# Patient Record
Sex: Female | Born: 1960 | Race: White | Hispanic: No | Smoking: Former smoker
Health system: Southern US, Community
[De-identification: ages and names within clinical notes are randomized; demographics above are authoritative.]

## PROBLEM LIST (undated history)

## (undated) DIAGNOSIS — E785 Hyperlipidemia, unspecified: Secondary | ICD-10-CM

## (undated) DIAGNOSIS — M791 Myalgia, unspecified site: Secondary | ICD-10-CM

## (undated) DIAGNOSIS — E282 Polycystic ovarian syndrome: Secondary | ICD-10-CM

## (undated) DIAGNOSIS — I10 Essential (primary) hypertension: Secondary | ICD-10-CM

## (undated) DIAGNOSIS — R059 Cough, unspecified: Secondary | ICD-10-CM

## (undated) DIAGNOSIS — E039 Hypothyroidism, unspecified: Secondary | ICD-10-CM

## (undated) DIAGNOSIS — R05 Cough: Secondary | ICD-10-CM

## (undated) DIAGNOSIS — T7840XA Allergy, unspecified, initial encounter: Secondary | ICD-10-CM

## (undated) DIAGNOSIS — E119 Type 2 diabetes mellitus without complications: Secondary | ICD-10-CM

## (undated) DIAGNOSIS — R5383 Other fatigue: Secondary | ICD-10-CM

## (undated) HISTORY — DX: Type 2 diabetes mellitus without complications: E11.9

## (undated) HISTORY — PX: OOPHORECTOMY: SHX86

## (undated) HISTORY — DX: Allergy, unspecified, initial encounter: T78.40XA

## (undated) HISTORY — DX: Other fatigue: R53.83

## (undated) HISTORY — DX: Hypothyroidism, unspecified: E03.9

## (undated) HISTORY — DX: Hyperlipidemia, unspecified: E78.5

## (undated) HISTORY — PX: ABDOMINAL HYSTERECTOMY: SHX81

## (undated) HISTORY — DX: Myalgia, unspecified site: M79.10

## (undated) HISTORY — DX: Polycystic ovarian syndrome: E28.2

## (undated) HISTORY — DX: Cough, unspecified: R05.9

## (undated) HISTORY — DX: Cough: R05

---

## 2007-07-21 ENCOUNTER — Emergency Department: Payer: Self-pay | Admitting: Emergency Medicine

## 2011-01-27 ENCOUNTER — Emergency Department (HOSPITAL_COMMUNITY)
Admission: EM | Admit: 2011-01-27 | Discharge: 2011-01-27 | Disposition: A | Discharge status: Home / Self Care | Payer: No Typology Code available for payment source | Attending: Emergency Medicine | Admitting: Emergency Medicine

## 2011-01-27 ENCOUNTER — Emergency Department (HOSPITAL_COMMUNITY): Payer: No Typology Code available for payment source

## 2011-01-27 DIAGNOSIS — J45909 Unspecified asthma, uncomplicated: Secondary | ICD-10-CM | POA: Insufficient documentation

## 2011-01-27 DIAGNOSIS — M542 Cervicalgia: Secondary | ICD-10-CM | POA: Insufficient documentation

## 2011-01-27 DIAGNOSIS — S7000XA Contusion of unspecified hip, initial encounter: Secondary | ICD-10-CM | POA: Insufficient documentation

## 2011-01-27 DIAGNOSIS — I10 Essential (primary) hypertension: Secondary | ICD-10-CM | POA: Insufficient documentation

## 2011-01-27 DIAGNOSIS — E039 Hypothyroidism, unspecified: Secondary | ICD-10-CM | POA: Insufficient documentation

## 2011-01-27 DIAGNOSIS — M25559 Pain in unspecified hip: Secondary | ICD-10-CM | POA: Insufficient documentation

## 2011-11-16 ENCOUNTER — Encounter (HOSPITAL_COMMUNITY): Payer: Self-pay

## 2011-11-16 ENCOUNTER — Emergency Department (INDEPENDENT_AMBULATORY_CARE_PROVIDER_SITE_OTHER): Payer: No Typology Code available for payment source

## 2011-11-16 ENCOUNTER — Emergency Department (INDEPENDENT_AMBULATORY_CARE_PROVIDER_SITE_OTHER)
Admission: EM | Admit: 2011-11-16 | Discharge: 2011-11-16 | Disposition: A | Discharge status: Home / Self Care | Payer: Self-pay | Source: Home / Self Care | Attending: Family Medicine | Admitting: Family Medicine

## 2011-11-16 DIAGNOSIS — S8000XA Contusion of unspecified knee, initial encounter: Secondary | ICD-10-CM

## 2011-11-16 DIAGNOSIS — M25469 Effusion, unspecified knee: Secondary | ICD-10-CM

## 2011-11-16 HISTORY — DX: Essential (primary) hypertension: I10

## 2011-11-16 MED ORDER — HYDROCODONE-ACETAMINOPHEN 5-325 MG PO TABS: ORAL_TABLET | ORAL | Status: AC

## 2011-11-16 NOTE — ED Provider Notes (Addendum)
History     CSN: [id number]  Arrival date & time 11/16/11  1419   First MD Initiated Contact with Patient 11/16/11 1526      Chief Complaint  Patient presents with  . Leg Injury    (Consider location/radiation/quality/duration/timing/severity/associated sxs/prior treatment) HPI Comments: Lacey Hines presents for evaluation of pain, swelling, and bruising over her RIGHT knee. She reports that she slipped earlier today at a restaurant on an area of floor where some tea was spilled. She struck her RIGHT knee on a marble floor. She did not strike her head.   Patient is a 51 y.o. female presenting with knee pain. The history is provided by the patient.  Knee Pain This is a new problem. The problem occurs constantly. The problem has not changed since onset.The symptoms are aggravated by walking, bending, exertion and standing. The symptoms are relieved by nothing.    Past Medical History  Diagnosis Date  . Asthma   . Hypertension   . Thyroid disease     History reviewed. No pertinent past surgical history.  History reviewed. No pertinent family history.  History  Substance Use Topics  . Smoking status: Never Smoker   . Smokeless tobacco: Not on file  . Alcohol Use: No    OB History    Grav Para Term Preterm Abortions TAB SAB Ect Mult Living                  Review of Systems  Constitutional: Negative.   HENT: Negative.   Eyes: Negative.   Respiratory: Negative.   Cardiovascular: Negative.   Gastrointestinal: Negative.   Genitourinary: Negative.   Musculoskeletal: Positive for joint swelling and gait problem.       RIGHT knee pain  Skin: Negative.   Neurological: Negative.     Allergies  Review of patient's allergies indicates no known allergies.  Home Medications   Current Outpatient Rx  Name Route Sig Dispense Refill  . AMLODIPINE BESYLATE 2.5 MG PO TABS Oral Take 5 mg by mouth daily.    Marland Kitchen LEVOTHYROXINE SODIUM 25 MCG PO TABS Oral Take 25 mcg by mouth daily.     Marland Kitchen METFORMIN HCL 1000 MG PO TABS Oral Take 1,000 mg by mouth 2 (two) times daily with a meal.    . HYDROCODONE-ACETAMINOPHEN 5-325 MG PO TABS  Take one to two tablets every 4 to 6 hours as needed for pain 20 tablet 0    BP 152/85  Pulse 70  Temp(Src) 98.6 F (37 C) (Oral)  Resp 18  SpO2 100%  Physical Exam  Nursing note and vitals reviewed. Constitutional: She is oriented to person, place, and time. She appears well-developed and well-nourished.  HENT:  Head: Normocephalic and atraumatic.  Eyes: EOM are normal.  Neck: Normal range of motion.  Pulmonary/Chest: Effort normal.  Musculoskeletal: Normal range of motion.       Right knee: She exhibits swelling and ecchymosis. She exhibits no laceration. tenderness found. Medial joint line tenderness noted. No lateral joint line, no MCL, no LCL and no patellar tendon tenderness noted.       Legs: Neurological: She is alert and oriented to person, place, and time.  Skin: Skin is warm and dry.  Psychiatric: Her behavior is normal.    ED Course  Procedures (including critical care time)  Labs Reviewed - No data to display Dg Knee Ap/lat W/sunrise Right  11/16/2011  *RADIOLOGY REPORT*  Clinical Data: Fall, knee pain.  DG KNEE - 3 VIEWS  Comparison:  None  Findings: No acute bony abnormality.  Specifically, no fracture, subluxation, or dislocation.  Soft tissues are intact.  No joint effusion.  Soft tissue swelling over the anterior aspect of the lower knee.  IMPRESSION: Anterior soft tissue swelling.  No underlying bony abnormality.  No effusion.  Original Report Authenticated By: Cyndie Chime, M.D.     1. Knee contusion   2. Prepatellar effusion   3. Traumatic hematoma of knee       MDM  RIGHT knee xray: no acute findings; reviewed by radiologist and myself; knee contusion and prepatellar hematoma; rx given for hydrocodone and RICE        Richardo Priest, MD 11/16/11 1708  Richardo Priest, MD 11/16/11 1739

## 2011-11-16 NOTE — ED Notes (Signed)
Slipped on wet spot  floor  ~8;30 am today , landed on right knee and right elbow ; was partly numb until just PTA; pain , swelling, echymosis noted ; denies LOC

## 2013-05-18 ENCOUNTER — Encounter: Payer: Self-pay | Admitting: Nurse Practitioner

## 2013-05-18 ENCOUNTER — Ambulatory Visit (INDEPENDENT_AMBULATORY_CARE_PROVIDER_SITE_OTHER): Payer: 59 | Admitting: Nurse Practitioner

## 2013-05-18 VITALS — BP 148/96 | HR 70 | Temp 98.0°F | Resp 14 | Ht 63.0 in | Wt 231.8 lb

## 2013-05-18 DIAGNOSIS — I10 Essential (primary) hypertension: Secondary | ICD-10-CM

## 2013-05-18 DIAGNOSIS — E119 Type 2 diabetes mellitus without complications: Secondary | ICD-10-CM

## 2013-05-18 DIAGNOSIS — E785 Hyperlipidemia, unspecified: Secondary | ICD-10-CM

## 2013-05-18 DIAGNOSIS — E039 Hypothyroidism, unspecified: Secondary | ICD-10-CM | POA: Insufficient documentation

## 2013-05-18 NOTE — Patient Instructions (Signed)
Will get blood work today  In 4 weeks please return for physical with lab work before visit

## 2013-05-18 NOTE — Progress Notes (Signed)
Patient ID: Lacey Hines, female   DOB: 04-Mar-1961, 52 y.o.   MRN: [id number]   No Known Allergies  Chief Complaint  Patient presents with  . NP to Establish    HPI: Patient is a 52 y.o. 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 Negative for fever and chills.       Weight gain and sweats which has been worse since thyroid medication  HENT: Positive for ear pain (pain in right ear for a few days), congestion and sore throat. Negative for hearing loss, tinnitus and ear discharge.        Ear pain, nasal and chest congestion, sore throat for 3 days   Eyes:       Wears reading glasses; has not been to an eye MD   Respiratory: Positive for cough, sputum production (at times she has clear sputum), shortness of breath (in the last few weeks she has had to use her albuterol 4 times  in 2 weeks) and wheezing.   Cardiovascular: Positive for palpitations (with pain in the last month). Negative for chest pain.  Gastrointestinal: Negative for heartburn, abdominal pain, diarrhea and constipation.  Genitourinary: Negative for dysuria, urgency and frequency.  Musculoskeletal: Positive for myalgias and joint pain.       Takes ibuprofen- once weekly to help    Skin: Negative.   Neurological: Positive for dizziness (hx of vertigo), tingling (in both lower legs) and headaches.  Endo/Heme/Allergies: Positive for environmental allergies.  Psychiatric/Behavioral: The patient has insomnia (no trouble going to sleep but trouble staying to sleep).        Mood swings     Past Medical History  Diagnosis Date  . Asthma   . Hypertension   . Hypothyroid   . Other and unspecified hyperlipidemia    . Polycystic disease, ovaries   . Cough   . Type II or unspecified type diabetes mellitus without mention of complication, not stated as uncontrolled   . Fatigue   . Myalgia   . Allergy    Past Surgical History  Procedure Laterality Date  . Oophorectomy    . Abdominal hysterectomy      complete hysterectomy   Social History:   reports that she has quit smoking. She does not have any smokeless tobacco history on file. She reports that she does not drink alcohol or use illicit drugs.  Family History  Problem Relation Age of Onset  . Hypertension Mother   . Hyperlipidemia Mother   . Diabetes Father   . Heart disease Father   . Stroke Father   . Hypertension Father   . Hyperlipidemia Father   . Hypertension Brother   . Hyperlipidemia Brother   . Hypertension Brother   . Hyperlipidemia Brother   . Hypertension Brother   . Hyperlipidemia Brother     Medications: Patient's Medications  New Prescriptions   No medications on file  Previous Medications   LEVOTHYROXINE (SYNTHROID, LEVOTHROID) 50 MCG TABLET    Take 50 mcg by mouth daily before breakfast.   METFORMIN (GLUCOPHAGE) 500 MG TABLET    Take 500 mg by mouth 2 (two) times daily with a meal.  Modified Medications  No medications on file  Discontinued Medications   AMLODIPINE (NORVASC) 2.5 MG TABLET    Take 5 mg by mouth daily.   LEVOTHYROXINE (SYNTHROID, LEVOTHROID) 25 MCG TABLET    Take 25 mcg by mouth daily.   METFORMIN (GLUCOPHAGE) 1000 MG TABLET    Take 1,000 mg by mouth 2 (two) times daily with a meal.     Physical Exam:  Filed Vitals:   05/18/13 1327  BP: 148/96  Pulse: 70  Temp: 98 F (36.7 C)  TempSrc: Oral  Resp: 14  Height: 5\' 3"  (1.6 m)  Weight: 231 lb 12.8 oz (105.144 kg)   Physical Exam  Constitutional: She is oriented to person, place, and time and well-developed, well-nourished, and in no distress. No distress.  HENT:  Head: Normocephalic and atraumatic.  Right Ear: External ear normal.   Left Ear: External ear normal.  Eyes: Conjunctivae and EOM are normal. Pupils are equal, round, and reactive to light.  Neck: Normal range of motion. Neck supple.  Cardiovascular: Normal rate, regular rhythm and normal heart sounds.   Pulmonary/Chest: Effort normal and breath sounds normal. No respiratory distress. She has no wheezes.  Abdominal: Soft. Bowel sounds are normal. She exhibits no distension. There is no tenderness.  Musculoskeletal: Normal range of motion. She exhibits no edema and no tenderness.  Neurological: She is alert and oriented to person, place, and time.  Skin: Skin is warm and dry. She is not diaphoretic.  Psychiatric: Affect normal.    Labs reviewed: Immunization: influenza 2013 Pneumococcal 2009  Tetanus - unknown   Assessment/Plan  1.   Hypothyroid 244.9     Pt was previously on synthroid 50 mcg but has not taking this for over a month in a half. Will get TSH today   2.   Other and unspecified hyperlipidemia 272.4     Will get fasting lipids before next visit    3.   Hypertension 401.9     Pt currently not on medication for this- will cont to monitor BP and get labs at this time. Encouraged  heart healthy diet    4.   Type II or unspecified type diabetes mellitus without mention of complication, not stated as uncontrolled   Will get AIC and urine for protein today  To follow up in 1 month for EV

## 2013-05-19 LAB — CBC WITH DIFFERENTIAL
Basophils Absolute: 0 10*3/uL (ref 0.0–0.2)
Immature Grans (Abs): 0 10*3/uL (ref 0.0–0.1)
Immature Granulocytes: 0 % (ref 0–2)
Lymphocytes Absolute: 2.5 10*3/uL (ref 0.7–3.1)
Lymphs: 35 % (ref 14–46)
MCV: 88 fL (ref 79–97)
Monocytes: 8 % (ref 4–12)
Neutrophils Relative %: 55 % (ref 40–74)
Platelets: 359 10*3/uL (ref 150–379)
RBC: 4.68 x10E6/uL (ref 3.77–5.28)
RDW: 14.5 % (ref 12.3–15.4)
WBC: 7.2 10*3/uL (ref 3.4–10.8)

## 2013-05-19 LAB — HEMOGLOBIN A1C
Est. average glucose Bld gHb Est-mCnc: 137 mg/dL
Hgb A1c MFr Bld: 6.4 % — ABNORMAL HIGH (ref 4.8–5.6)

## 2013-05-19 LAB — COMPREHENSIVE METABOLIC PANEL
ALT: 29 IU/L (ref 0–32)
AST: 20 IU/L (ref 0–40)
CO2: 22 mmol/L (ref 18–29)
Calcium: 9.6 mg/dL (ref 8.7–10.2)
Chloride: 104 mmol/L (ref 97–108)
Glucose: 104 mg/dL — ABNORMAL HIGH (ref 65–99)
Potassium: 4 mmol/L (ref 3.5–5.2)
Sodium: 142 mmol/L (ref 134–144)
Total Protein: 7.2 g/dL (ref 6.0–8.5)

## 2013-05-19 LAB — MICROALBUMIN, URINE: Microalbumin, Urine: 6.2 ug/mL (ref 0.0–17.0)

## 2013-06-11 ENCOUNTER — Other Ambulatory Visit: Payer: 59

## 2013-06-11 DIAGNOSIS — E785 Hyperlipidemia, unspecified: Secondary | ICD-10-CM

## 2013-06-12 LAB — LIPID PANEL
Cholesterol, Total: 227 mg/dL — ABNORMAL HIGH (ref 100–199)
LDL Calculated: 154 mg/dL — ABNORMAL HIGH (ref 0–99)

## 2013-06-15 ENCOUNTER — Ambulatory Visit (INDEPENDENT_AMBULATORY_CARE_PROVIDER_SITE_OTHER): Payer: 59 | Admitting: Nurse Practitioner

## 2013-06-15 ENCOUNTER — Encounter: Payer: Self-pay | Admitting: Nurse Practitioner

## 2013-06-15 VITALS — BP 142/90 | HR 71 | Temp 99.5°F | Resp 16 | Ht 63.0 in | Wt 230.4 lb

## 2013-06-15 DIAGNOSIS — E785 Hyperlipidemia, unspecified: Secondary | ICD-10-CM

## 2013-06-15 DIAGNOSIS — E039 Hypothyroidism, unspecified: Secondary | ICD-10-CM

## 2013-06-15 DIAGNOSIS — I10 Essential (primary) hypertension: Secondary | ICD-10-CM

## 2013-06-15 DIAGNOSIS — J329 Chronic sinusitis, unspecified: Secondary | ICD-10-CM

## 2013-06-15 DIAGNOSIS — E119 Type 2 diabetes mellitus without complications: Secondary | ICD-10-CM

## 2013-06-15 DIAGNOSIS — Z23 Encounter for immunization: Secondary | ICD-10-CM

## 2013-06-15 DIAGNOSIS — Z139 Encounter for screening, unspecified: Secondary | ICD-10-CM

## 2013-06-15 DIAGNOSIS — Z0000 Encounter for general adult medical examination without abnormal findings: Secondary | ICD-10-CM

## 2013-06-15 MED ORDER — METFORMIN HCL 500 MG PO TABS: 500.0000 mg | ORAL_TABLET | Freq: Two times a day (BID) | ORAL | Status: DC

## 2013-06-15 MED ORDER — ATORVASTATIN CALCIUM 10 MG PO TABS: 10.0000 mg | ORAL_TABLET | Freq: Every day | ORAL | Status: DC

## 2013-06-15 MED ORDER — LOSARTAN POTASSIUM 25 MG PO TABS: 25.0000 mg | ORAL_TABLET | Freq: Every day | ORAL | Status: DC

## 2013-06-15 MED ORDER — AMOXICILLIN-POT CLAVULANATE 875-125 MG PO TABS: 1.0000 | ORAL_TABLET | Freq: Two times a day (BID) | ORAL | Status: DC

## 2013-06-15 NOTE — Patient Instructions (Addendum)
Cholesterol: starting you on Lipitor 10 mg every night - cont heart healthy diabetic diet and exercise 30 mins 5 times a day  Diabetes: cont metformin twice daily; cont heart healthy diabetic diet and exercise 30 mins 5 times a day  Thyroid: not on medication- will remove this from our records and cont to follow your thyroid High blood pressure: will start you on losartan 25 mg daily for blood pressure; cont heart healthy diabetic diet and exercise 30 mins 5 times a day  Insomnia: may take melatonin 30 mins-1 hr before bed.  Preventive Healthcare- you do not want colonoscopy so we will get a stool sample for blood, will order screening mammogram, given TDAP vaccine today  Sinusitis- Augmentin twice daily for 10 days has been faxed to your pharmacy   Cardiac Diet This diet can help prevent heart disease and stroke. Many factors influence your heart health, including eating and exercise habits. Coronary risk rises a lot with abnormal blood fat (lipid) levels. Cardiac meal planning includes limiting unhealthy fats, increasing healthy fats, and making other small dietary changes. General guidelines are as follows:  Adjust calorie intake to reach and maintain desirable body weight.  Limit total fat intake to less than 30% of total calories. Saturated fat should be less than 7% of calories.  Saturated fats are found in animal products and in some vegetable products. Saturated vegetable fats are found in coconut oil, cocoa butter, palm oil, and palm kernel oil. Read labels carefully to avoid these products as much as possible. Use butter in moderation. Choose tub margarines and oils that have 2 grams of fat or less. Good cooking oils are canola and olive oils.  Practice low-fat cooking techniques. Do not fry food. Instead, broil, bake, boil, steam, grill, roast on a rack, stir-fry, or microwave it. Other fat reducing suggestions include:  Remove the skin from poultry.  Remove all visible fat from  meats.  Skim the fat off stews, soups, and gravies before serving them.  Steam vegetables in water or broth instead of sauting them in fat.  Avoid foods with trans fat (or hydrogenated oils), such as commercially fried foods and commercially baked goods. Commercial shortening and deep-frying fats will contain trans fat.  Increase intake of fruits, vegetables, whole grains, and legumes to replace foods high in fat.  Increase consumption of nuts, legumes, and seeds to at least 4 servings weekly. One serving of a legume equals  cup, and 1 serving of nuts or seeds equals  cup.  Choose whole grains more often. Have 3 servings per day (a serving is 1 ounce [oz]).  Eat 4 to 5 servings of vegetables per day. A serving of vegetables is 1 cup of raw leafy vegetables;  cup of raw or cooked cut-up vegetables;  cup of vegetable juice.  Eat 4 to 5 servings of fruit per day. A serving of fruit is 1 medium whole fruit;  cup of dried fruit;  cup of fresh, frozen, or canned fruit;  cup of 100% fruit juice.  Increase your intake of dietary fiber to 20 to 30 grams per day. Insoluble fiber may help lower your risk of heart disease and may help curb your appetite.  Soluble fiber binds cholesterol to be removed from the blood. Foods high in soluble fiber are dried beans, citrus fruits, oats, apples, bananas, broccoli, Brussels sprouts, and eggplant.  Try to include foods fortified with plant sterols or stanols, such as yogurt, breads, juices, or margarines. Choose several fortified foods  to achieve a daily intake of 2 to 3 grams of plant sterols or stanols.  Foods with omega-3 fats can help reduce your risk of heart disease. Aim to have a 3.5 oz portion of fatty fish twice per week, such as salmon, mackerel, albacore tuna, sardines, lake trout, or herring. If you wish to take a fish oil supplement, choose one that contains 1 gram of both DHA and EPA.  Limit processed meats to 2 servings (3 oz portion)  weekly.  Limit the sodium in your diet to 1500 milligrams (mg) per day. If you have high blood pressure, talk to a registered dietitian about a DASH (Dietary Approaches to Stop Hypertension) eating plan.  Limit sweets and beverages with added sugar, such as soda, to no more than 5 servings per week. One serving is:   1 tablespoon sugar.  1 tablespoon jelly or jam.   cup sorbet.  1 cup lemonade.   cup regular soda. CHOOSING FOODS Starches  Allowed: Breads: All kinds (wheat, rye, raisin, white, oatmeal, [place], [place], and English muffin bread). Low-fat rolls: English muffins, frankfurter and hamburger buns, bagels, pita bread, tortillas (not fried). Pancakes, waffles, biscuits, and muffins made with recommended oil.  Avoid: Products made with saturated or trans fats, oils, or whole milk products. Butter rolls, cheese breads, croissants. Commercial doughnuts, muffins, sweet rolls, biscuits, waffles, pancakes, store-bought mixes. Crackers  Allowed: Low-fat crackers and snacks: Animal, graham, rye, saltine (with recommended oil, no lard), oyster, and matzo crackers. Bread sticks, melba toast, rusks, flatbread, pretzels, and light popcorn.  Avoid: High-fat crackers: cheese crackers, butter crackers, and those made with coconut, palm oil, or trans fat (hydrogenated oils). Buttered popcorn. Cereals  Allowed: Hot or cold whole-grain cereals.  Avoid: Cereals containing coconut, hydrogenated vegetable fat, or animal fat. Potatoes / Pasta / Rice  Allowed: All kinds of potatoes, rice, and pasta (such as macaroni, spaghetti, and noodles).  Avoid: Pasta or rice prepared with cream sauce or high-fat cheese. Chow mein noodles, [place] fries. Vegetables  Allowed: All vegetables and vegetable juices.  Avoid: Fried vegetables. Vegetables in cream, butter, or high-fat cheese sauces. Limit coconut. Fruit in cream or custard. Protein  Allowed: Limit your intake of meat, seafood, and poultry  to no more than 6 oz (cooked weight) per day. All lean, well-trimmed beef, veal, pork, and lamb. All chicken and [place] without skin. All fish and shellfish. Wild game: wild duck, rabbit, pheasant, and venison. Egg whites or low-cholesterol egg substitutes may be used as desired. Meatless dishes: recipes with dried beans, peas, lentils, and tofu (soybean curd). Seeds and nuts: all seeds and most nuts.  Avoid: Prime grade and other heavily marbled and fatty meats, such as short ribs, spare ribs, rib eye roast or steak, frankfurters, sausage, bacon, and high-fat luncheon meats, mutton. Caviar. Commercially fried fish. Domestic duck, goose, venison sausage. Organ meats: liver, gizzard, heart, chitterlings, brains, kidney, sweetbreads. Dairy  Allowed: Low-fat cheeses: nonfat or low-fat cottage cheese (1% or 2% fat), cheeses made with part skim milk, such as mozzarella, farmers, string, or ricotta. (Cheeses should be labeled no more than 2 to 6 grams fat per oz.). Skim (or 1%) milk: liquid, powdered, or evaporated. Buttermilk made with low-fat milk. Drinks made with skim or low-fat milk or cocoa. Chocolate milk or cocoa made with skim or low-fat (1%) milk. Nonfat or low-fat yogurt.  Avoid: Whole milk cheeses, including colby, cheddar, [place], [address], [place], [place], [place], [address], [place], and blue. Creamed cottage cheese, cream cheese. Whole milk  and whole milk products, including buttermilk or yogurt made from whole milk, drinks made from whole milk. Condensed milk, evaporated whole milk, and 2% milk. Soups and Combination Foods  Allowed: Low-fat low-sodium soups: broth, dehydrated soups, homemade broth, soups with the fat removed, homemade cream soups made with skim or low-fat milk. Low-fat spaghetti, lasagna, chili, and Spanish rice if low-fat ingredients and low-fat cooking techniques are used.  Avoid: Cream soups made with whole milk, cream, or high-fat cheese. All other soups. Desserts and  Sweets  Allowed: Sherbet, fruit ices, gelatins, meringues, and angel food cake. Homemade desserts with recommended fats, oils, and milk products. Jam, jelly, honey, marmalade, sugars, and syrups. Pure sugar candy, such as gum drops, hard candy, jelly beans, marshmallows, mints, and small amounts of dark chocolate.  Avoid: Commercially prepared cakes, pies, cookies, frosting, pudding, or mixes for these products. Desserts containing whole milk products, chocolate, coconut, lard, palm oil, or palm kernel oil. Ice cream or ice cream drinks. Candy that contains chocolate, coconut, butter, hydrogenated fat, or unknown ingredients. Buttered syrups. Fats and Oils  Allowed: Vegetable oils: safflower, sunflower, corn, soybean, cottonseed, sesame, canola, olive, or peanut. Non-hydrogenated margarines. Salad dressing or mayonnaise: homemade or commercial, made with a recommended oil. Low or nonfat salad dressing or mayonnaise.  Limit added fats and oils to 6 to 8 tsp per day (includes fats used in cooking, baking, salads, and spreads on bread). Remember to count the "hidden fats" in foods.  Avoid: Solid fats and shortenings: butter, lard, salt pork, bacon drippings. Gravy containing meat fat, shortening, or suet. Cocoa butter, coconut. Coconut oil, palm oil, palm kernel oil, or hydrogenated oils: these ingredients are often used in bakery products, nondairy creamers, whipped toppings, candy, and commercially fried foods. Read labels carefully. Salad dressings made of unknown oils, sour cream, or cheese, such as blue cheese and Roquef[place]. Cream, all kinds: half-and-half, light, heavy, or whipping. Sour cream or cream cheese (even if "light" or low-fat). Nondairy cream substitutes: coffee creamers and sour cream substitutes made with palm, palm kernel, hydrogenated oils, or coconut oil. Beverages  Allowed: Coffee (regular or decaffeinated), tea. Diet carbonated beverages, mineral water. Alcohol: Check with your  caregiver. Moderation is recommended.  Avoid: Whole milk, regular sodas, and juice drinks with added sugar. Condiments  Allowed: All seasonings and condiments. Cocoa powder. "Cream" sauces made with recommended ingredients.  Avoid: Carob powder made with hydrogenated fats. SAMPLE MENU Breakfast   cup orange juice   cup oatmeal  1 slice toast  1 tsp margarine  1 cup skim milk Lunch  [place] sandwich with 2 oz [place], 2 slices bread  Lettuce and tomato slices  Fresh fruit  Carrot sticks  Coffee or tea Snack  Fresh fruit or low-fat crackers Dinner  3 oz lean ground beef  1 baked potato  1 tsp margarine   cup asparagus  Lettuce salad  1 tbs non-creamy dressing   cup peach slices  1 cup skim milk Document Released: 07/31/2008 Document Revised: 04/22/2012 Document Reviewed: 01/15/2012 Exit[employer] Patient Information 2014 [place], [place].  Sinusitis Sinusitis is redness, soreness, and swelling (inflammation) of the paranasal sinuses. Paranasal sinuses are air pockets within the bones of your face (beneath the eyes, the middle of the forehead, or above the eyes). In healthy paranasal sinuses, mucus is able to drain out, and air is able to circulate through them by way of your nose. However, when your paranasal sinuses are inflamed, mucus and air can become trapped. This can allow bacteria and  other germs to grow and cause infection. Sinusitis can develop quickly and last only a short time (acute) or continue over a long period (chronic). Sinusitis that lasts for more than 12 weeks is considered chronic.  CAUSES  Causes of sinusitis include:  Allergies.  Structural abnormalities, such as displacement of the cartilage that separates your nostrils (deviated septum), which can decrease the air flow through your nose and sinuses and affect sinus drainage.  Functional abnormalities, such as when the small hairs (cilia) that line your sinuses and help remove mucus do  not work properly or are not present. SYMPTOMS  Symptoms of acute and chronic sinusitis are the same. The primary symptoms are pain and pressure around the affected sinuses. Other symptoms include:  Upper toothache.  Earache.  Headache.  Bad breath.  Decreased sense of smell and taste.  A cough, which worsens when you are lying flat.  Fatigue.  Fever.  Thick drainage from your nose, which often is green and may contain pus (purulent).  Swelling and warmth over the affected sinuses. DIAGNOSIS  Your caregiver will perform a physical exam. During the exam, your caregiver may:  Look in your nose for signs of abnormal growths in your nostrils (nasal polyps).  Tap over the affected sinus to check for signs of infection.  View the inside of your sinuses (endoscopy) with a special imaging device with a light attached (endoscope), which is inserted into your sinuses. If your caregiver suspects that you have chronic sinusitis, one or more of the following tests may be recommended:  Allergy tests.  Nasal culture A sample of mucus is taken from your nose and sent to a lab and screened for bacteria.  Nasal cytology A sample of mucus is taken from your nose and examined by your caregiver to determine if your sinusitis is related to an allergy. TREATMENT  Most cases of acute sinusitis are related to a viral infection and will resolve on their own within 10 days. Sometimes medicines are prescribed to help relieve symptoms (pain medicine, decongestants, nasal steroid sprays, or saline sprays).  However, for sinusitis related to a bacterial infection, your caregiver will prescribe antibiotic medicines. These are medicines that will help kill the bacteria causing the infection.  Rarely, sinusitis is caused by a fungal infection. In theses cases, your caregiver will prescribe antifungal medicine. For some cases of chronic sinusitis, surgery is needed. Generally, these are cases in which  sinusitis recurs more than 3 times per year, despite other treatments. HOME CARE INSTRUCTIONS   Drink plenty of water. Water helps thin the mucus so your sinuses can drain more easily.  Use a humidifier.  Inhale steam 3 to 4 times a day (for example, sit in the bathroom with the shower running).  Apply a warm, moist washcloth to your face 3 to 4 times a day, or as directed by your caregiver.  Use saline nasal sprays to help moisten and clean your sinuses.  Take over-the-counter or prescription medicines for pain, discomfort, or fever only as directed by your caregiver. SEEK IMMEDIATE MEDICAL CARE IF:  You have increasing pain or severe headaches.  You have nausea, vomiting, or drowsiness.  You have swelling around your face.  You have vision problems.  You have a stiff neck.  You have difficulty breathing. MAKE SURE YOU:   Understand these instructions.  Will watch your condition.  Will get help right away if you are not doing well or get worse. Document Released: 10/22/2005 Document Revised: 01/14/2012 Document  Reviewed: 11/06/2011 Exit[employer] Patient Information 2014 [place], [place].

## 2013-06-15 NOTE — Progress Notes (Addendum)
Patient ID: Lacey Hines, female   DOB: 05/05/1961, 52 y.o.   MRN: [id number]   No Known Allergies  Chief Complaint  Patient presents with  . Follow-up    ?thyroid, ears, throat and congestion,     HPI: Patient is a 52 y.o. female seen in the office today for extended visit.  Pt reports she has always had a problem with blood pressure and cholesterol. She always has eaten lean meat, fruits and vegetables, but due to heredity these have been high. Reports she is very active and does not live a sedentary lifestyle. Not sleeping which is new but she has had it before, was previously on Ambien CR which helped her. Has taken valarien root which has helped.  Was not on thyroid medication at last visit for atleast a month and TSH was 2.070 so therefore she never resumed thyroid medications.   Still having nasal congestion, ear pain, sore throat and yellow discharge from nose which has been ongoing for the past 4 weeks. No shortness of breath. Has had fevers off and on.  Review of Systems:  Review of Systems  Constitutional: Positive for fever. Negative for chills, weight loss and malaise/fatigue.  HENT: Positive for ear pain, congestion and sore throat. Negative for ear discharge.   Eyes: Negative.   Respiratory: Negative for cough, shortness of breath and wheezing.   Cardiovascular: Negative for chest pain, claudication and leg swelling.  Gastrointestinal: Negative for heartburn, abdominal pain, diarrhea and constipation.  Genitourinary: Negative for dysuria, urgency and frequency.  Musculoskeletal: Positive for joint pain. Negative for back pain and falls.  Skin: Negative.   Neurological: Positive for headaches. Negative for weakness.  Psychiatric/Behavioral: Negative for depression. The patient has insomnia. The patient is not nervous/anxious.      Past Medical History  Diagnosis Date  . Asthma   . Hypertension   . Hypothyroid   . Other and unspecified hyperlipidemia   . Polycystic  disease, ovaries   . Cough   . Type II or unspecified type diabetes mellitus without mention of complication, not stated as uncontrolled   . Fatigue   . Myalgia   . Allergy    Past Surgical History  Procedure Laterality Date  . Oophorectomy    . Abdominal hysterectomy      complete hysterectomy   Social History:   reports that she has quit smoking. She does not have any smokeless tobacco history on file. She reports that she does not drink alcohol or use illicit drugs.  Family History  Problem Relation Age of Onset  . Hypertension Mother   . Hyperlipidemia Mother   . Diabetes Father   . Heart disease Father   . Stroke Father   . Hypertension Father   . Hyperlipidemia Father   . Hypertension Brother   . Hyperlipidemia Brother   . Hypertension Brother   . Hyperlipidemia Brother   . Hypertension Brother   . Hyperlipidemia Brother     Medications: Patient's Medications  New Prescriptions   No medications on file  Previous Medications   ALBUTEROL (PROVENTIL HFA;VENTOLIN HFA) 108 (90 BASE) MCG/ACT INHALER    Inhale 2 puffs into the lungs every 6 (six) hours as needed for wheezing.   LEVOTHYROXINE (SYNTHROID, LEVOTHROID) 50 MCG TABLET    Take 50 mcg by mouth daily before breakfast.   METFORMIN (GLUCOPHAGE) 500 MG TABLET    Take 500 mg by mouth 2 (two) times daily with a meal.  Modified Medications   No medications on  file  Discontinued Medications   No medications on file     Physical Exam:  Filed Vitals:   06/15/13 1442  BP: 142/90  Pulse: 71  Temp: 99.5 F (37.5 C)  TempSrc: Oral  Resp: 16  Height: 5\' 3"  (1.6 m)  Weight: 230 lb 6.4 oz (104.509 kg)  SpO2: 99%    GENERAL APPEARANCE: Alert, conversant. Appropriately groomed. No acute distress.  SKIN: No diaphoresis rash, or wounds HEAD: Normocephalic, atraumatic  EYES: Conjunctiva/lids clear. Pupils round, reactive. EOMs intact.  EARS: External exam WNL, canals clear. Hearing grossly normal.  NOSE: No  deformity or clear increase in discharge.  MOUTH/THROAT: Lips w/o lesions. Mouth and throat normal. Tongue moist, w/o lesion.  NECK: No thyroid tenderness, enlargement or nodule  RESPIRATORY: Breathing is even, unlabored. Lung sounds are clear   CARDIOVASCULAR: Heart RRR no murmurs, rubs or gallops. No peripheral edema.  ARTERIAL: radial pulse 2+, DP pulse 2+  VENOUS: No varicosities. No venous stasis skin changes  GASTROINTESTINAL: Abdomen is soft, non-tender, not distended w/ normal bowel sounds. No mass, ventral or inguinal hernia. No organomegally GENITOURINARY: Bladder non tender, not distended  MUSCULOSKELETAL: No abnormal joints or musculature NEUROLOGIC: Oriented X3. Cranial nerves 2-12 grossly intact. Moves all extremities no tremor. PSYCHIATRIC: Mood and affect appropriate to situation, no behavioral issues  Labs reviewed: Basic Metabolic Panel:  Recent Labs  16/10/96 1441  NA 142  K 4.0  CL 104  CO2 22  GLUCOSE 104*  BUN 11  CREATININE 0.81  CALCIUM 9.6  TSH 2.070   Liver Function Tests:  Recent Labs  05/18/13 1441  AST 20  ALT 29  ALKPHOS 92  BILITOT 0.3  PROT 7.2   No results found for this basename: LIPASE, AMYLASE,  in the last 8760 hours No results found for this basename: AMMONIA,  in the last 8760 hours CBC:  Recent Labs  05/18/13 1441  WBC 7.2  NEUTROABS 4.0  HGB 14.1  HCT 41.1  MCV 88  PLT 359   Lipid Panel:  Recent Labs  06/11/13 0829  HDL 36*  LDLCALC 154*  TRIG 184*  CHOLHDL 6.3*    Past Procedures:     Assessment/Plan 1. Sinusitis -Cont Claritin  - amoxicillin-clavulanate (AUGMENTIN) 875-125 MG per tablet; Take 1 tablet by mouth 2 (two) times daily.  Dispense: 20 tablet; Refill: 0 - notify if failure to improve or if symptoms get worse.  2. Screening Pt did not want colonoscopy so we will get a home FOBT, will order screening mammogram, given TDAP vaccine today   3. Essential hypertension, benign Will start  medication today also pt to cont heart healthy diabetic diet- educated regarding exercise 30 mins 5 days a week.  - losartan (COZAAR) 25 MG tablet; Take 1 tablet (25 mg total) by mouth daily.  Dispense: 90 tablet; Refill: 3  4. Other and unspecified hyperlipidemia Will start medication today also pt to cont heart healthy diabetic diet- educated regarding exercise 30 mins 5 days a week.  - atorvastatin (LIPITOR) 10 MG tablet; Take 1 tablet (10 mg total) by mouth daily.  Dispense: 90 tablet; Refill: 3 - Lipid panel; Future  6. Hypothyroid Off all medications; will follow TSH  7. Type II or unspecified type diabetes mellitus without mention of complication, not stated as uncontrolled Pt not consitantly taking medication- to take metformin 500 mg BID will follow up A1c in 3 months  - Hemoglobin A1c; Future  8. V70.0 PREVENTIVE COUNSELING:  The patient was counseled  regarding the appropriate use of alcohol, regular self-examination of the breasts on a monthly basis, prevention of dental and periodontal disease, diet, regular sustained exercise for at least 30 minutes 3-4 times per week, routine screening interval for mammogram as recommended by the American Cancer Society and ACOG, importance of regular PAP smears, smoking cessation, the proper use of sunscreen and protective clothing, tobacco use,  and recommended schedule for GI hemoccult testing, colonoscopy, cholesterol, thyroid and diabetes screening.    Follow up in 3-4 months with [facility] for follow up in lipids, diabetes, and hypertension.    TIME PHYSICIAN WITH PATIENT: 60 mins Time TOTAL:  time greater than 50% of total time spent doing pt counseled and coordination of care regarding multiple medical issues and medication concerns

## 2013-07-01 ENCOUNTER — Ambulatory Visit
Admission: RE | Admit: 2013-07-01 | Discharge: 2013-07-01 | Disposition: A | Discharge status: Home / Self Care | Payer: 59 | Source: Ambulatory Visit | Attending: Nurse Practitioner | Admitting: Nurse Practitioner

## 2013-07-01 DIAGNOSIS — Z139 Encounter for screening, unspecified: Secondary | ICD-10-CM

## 2013-08-20 ENCOUNTER — Ambulatory Visit (INDEPENDENT_AMBULATORY_CARE_PROVIDER_SITE_OTHER): Payer: 59 | Admitting: Family Medicine

## 2013-08-20 ENCOUNTER — Encounter: Payer: Self-pay | Admitting: *Deleted

## 2013-08-20 ENCOUNTER — Encounter: Payer: Self-pay | Admitting: Family Medicine

## 2013-08-20 VITALS — BP 160/96 | HR 52 | Resp 16 | Ht 63.0 in | Wt 224.0 lb

## 2013-08-20 DIAGNOSIS — R079 Chest pain, unspecified: Secondary | ICD-10-CM

## 2013-08-20 DIAGNOSIS — E039 Hypothyroidism, unspecified: Secondary | ICD-10-CM

## 2013-08-20 DIAGNOSIS — R42 Dizziness and giddiness: Secondary | ICD-10-CM

## 2013-08-20 DIAGNOSIS — I10 Essential (primary) hypertension: Secondary | ICD-10-CM

## 2013-08-20 DIAGNOSIS — E785 Hyperlipidemia, unspecified: Secondary | ICD-10-CM

## 2013-08-20 DIAGNOSIS — E119 Type 2 diabetes mellitus without complications: Secondary | ICD-10-CM

## 2013-08-20 MED ORDER — NITROGLYCERIN 0.4 MG SL SUBL: SUBLINGUAL_TABLET | SUBLINGUAL | Status: AC

## 2013-08-20 MED ORDER — MECLIZINE HCL 25 MG PO TABS: 25.0000 mg | ORAL_TABLET | Freq: Three times a day (TID) | ORAL | Status: AC | PRN

## 2013-08-20 MED ORDER — CANAGLIFLOZIN-METFORMIN HCL 150-500 MG PO TABS: 1.0000 | ORAL_TABLET | Freq: Every day | ORAL | Status: DC

## 2013-08-20 MED ORDER — TELMISARTAN-HCTZ 80-12.5 MG PO TABS: 1.0000 | ORAL_TABLET | Freq: Every day | ORAL | Status: DC

## 2013-08-20 MED ORDER — ROSUVASTATIN CALCIUM 40 MG PO TABS: ORAL_TABLET | ORAL | Status: DC

## 2013-08-20 NOTE — Progress Notes (Signed)
Subjective:    Patient ID: Lacey Hines, female    DOB: September 15, 1961, 52 y.o.   MRN: [id number]  HPI  Lacey Hines is here today to discuss the conditions listed below:   1)  Dizziness:  She has been feeling dizzy for several weeks.  She says that she was diagnosed with Vertigo several years ago and felt the way she has been feeling.  She used to take Dramamine which typically helped her but says that it has not helped her very much this time.      2)  Chest Pain:  She has been having chest pain off and on since August (2 months)  This concerns her because of her family's history of heart problems.  3)  BP:  She is not currently on anything for her BP.        Review of Systems  Constitutional: Negative.   HENT: Negative.   Eyes: Negative.   Respiratory: Negative.   Cardiovascular: Positive for chest pain.  Gastrointestinal: Negative.   Endocrine: Negative.   Genitourinary: Negative.   Musculoskeletal: Negative.   Skin: Negative.   Allergic/Immunologic: Negative.   Neurological: Positive for dizziness and light-headedness.  Hematological: Negative.   Psychiatric/Behavioral: The patient is nervous/anxious.        Tearful    No Known Allergies   Past Medical History  Diagnosis Date  . Asthma   . Hypertension   . Hypothyroid   . Other and unspecified hyperlipidemia   . Polycystic disease, ovaries   . Cough   . Type II or unspecified type diabetes mellitus without mention of complication, not stated as uncontrolled   . Fatigue   . Myalgia   . Allergy      Family History  Problem Relation Age of Onset  . Hypertension Mother   . Hyperlipidemia Mother   . Diabetes Father   . Heart disease Father   . Stroke Father   . Hypertension Father   . Hyperlipidemia Father   . Hypertension Brother   . Hyperlipidemia Brother   . Hypertension Brother   . Hyperlipidemia Brother   . Hypertension Brother   . Hyperlipidemia Brother     History   Social History Narrative    Marital Status: Geographical information systems officer)    Children: None   Pets: Cat (1) Dogs (2)    Living Situation: Lives with Darryl   Occupation: Public relations account executive at [employer].     Education: Engineer, agricultural    Tobacco Use/Exposure:  None    Alcohol Use:  Occasional   Drug Use:  None   Diet:  Regular   Exercise:  None   Hobbies: Baking/Computers      Objective:   Physical Exam  Vitals reviewed. Constitutional: She is oriented to person, place, and time. She appears well-developed and well-nourished. No distress.  HENT:  Head: Normocephalic.  Eyes: No scleral icterus.  Neck: Neck supple. No JVD present. No thyromegaly present.  Cardiovascular: Normal rate, regular rhythm and normal heart sounds.  Exam reveals no gallop and no friction rub.   No murmur heard. Pulmonary/Chest: Effort normal and breath sounds normal. She has no wheezes. She exhibits no tenderness.  Abdominal: She exhibits no distension. There is no tenderness.  Musculoskeletal: She exhibits no edema and no tenderness.  Lymphadenopathy:    She has no cervical adenopathy.  Neurological: She is alert and oriented to person, place, and time.  Skin: Skin is warm and dry.  Psychiatric: She has a normal  mood and affect. Her behavior is normal. Judgment and thought content normal.      Assessment & Plan:    Lacey Hines was seen today for dizziness and chest pain.  Diagnoses and associated orders for this visit:  Chest pain, unspecified - EKG 12-Lead - nitroGLYCERIN (NITROSTAT) 0.4 MG SL tablet; Put 1 tab under tongue for chest pain. Wait 5 min and repeat x 2 if needed before calling 911. - Ambulatory referral to Cardiology  Essential hypertension, benign - telmisartan-hydrochlorothiazide (MICARDIS HCT) 80-12.5 MG per tablet; Take 1 tablet by mouth daily.  Type II or unspecified type diabetes mellitus without mention of complication, not stated as uncontrolled -    Canagliflozin-Metformin HCl 150-500 MG TABS; Take  1 tablet by mouth daily. - Comp Met (CMET); Future - Hemoglobin A1c; Future  Other and unspecified hyperlipidemia -     rosuvastatin (CRESTOR) 40 MG tablet; Take 1/2 - 1 tab po daily - Lipid panel; Future  Unspecified hypothyroidism - TSH; Future - T3, free; Future - T4, free; Future  Dizziness and giddiness - meclizine (ANTIVERT) 25 MG tablet; Take 1 tablet (25 mg total) by mouth 3 (three) times daily as needed for dizziness or nausea.  TIME SPENT "FACE TO FACE" WITH PATIENT -  60 MINS

## 2013-08-20 NOTE — Patient Instructions (Addendum)
1)  Chest Pain - We are going to refer you to a cardiologist.  In the meantime, take a Baby ASA 81 mg and use the nitro if needed.  Don't hesitate to go to the ED if you are concerned.   2)  Cholesterol - Start with 10 of Crestor then increase to 20.  3)  Blood Sugar - Take Invokamet 150/500 daily.  4)  Labs - Return after 09/13/13 for fasting labs.    5)  Dizziness - Try the Hall-Pike Maneuver - Take Meclizine.    Chest Pain (Nonspecific) It is often hard to give a specific diagnosis for the cause of chest pain. There is always a chance that your pain could be related to something serious, such as a heart attack or a blood clot in the lungs. You need to follow up with your caregiver for further evaluation. CAUSES   Heartburn.  Pneumonia or bronchitis.  Anxiety or stress.  Inflammation around your heart (pericarditis) or lung (pleuritis or pleurisy).  A blood clot in the lung.  A collapsed lung (pneumothorax). It can develop suddenly on its own (spontaneous pneumothorax) or from injury (trauma) to the chest.  Shingles infection (herpes zoster virus). The chest wall is composed of bones, muscles, and cartilage. Any of these can be the source of the pain.  The bones can be bruised by injury.  The muscles or cartilage can be strained by coughing or overwork.  The cartilage can be affected by inflammation and become sore (costochondritis). DIAGNOSIS  Lab tests or other studies, such as X-rays, electrocardiography, stress testing, or cardiac imaging, may be needed to find the cause of your pain.  TREATMENT   Treatment depends on what may be causing your chest pain. Treatment may include:  Acid blockers for heartburn.  Anti-inflammatory medicine.  Pain medicine for inflammatory conditions.  Antibiotics if an infection is present.  You may be advised to change lifestyle habits. This includes stopping smoking and avoiding alcohol, caffeine, and chocolate.  You may be advised  to keep your head raised (elevated) when sleeping. This reduces the chance of acid going backward from your stomach into your esophagus.  Most of the time, nonspecific chest pain will improve within 2 to 3 days with rest and mild pain medicine. HOME CARE INSTRUCTIONS   If antibiotics were prescribed, take your antibiotics as directed. Finish them even if you start to feel better.  For the next few days, avoid physical activities that bring on chest pain. Continue physical activities as directed.  Do not smoke.  Avoid drinking alcohol.  Only take over-the-counter or prescription medicine for pain, discomfort, or fever as directed by your caregiver.  Follow your caregiver's suggestions for further testing if your chest pain does not go away.  Keep any follow-up appointments you made. If you do not go to an appointment, you could develop lasting (chronic) problems with pain. If there is any problem keeping an appointment, you must call to reschedule. SEEK MEDICAL CARE IF:   You think you are having problems from the medicine you are taking. Read your medicine instructions carefully.  Your chest pain does not go away, even after treatment.  You develop a rash with blisters on your chest. SEEK IMMEDIATE MEDICAL CARE IF:   You have increased chest pain or pain that spreads to your arm, neck, jaw, back, or abdomen.  You develop shortness of breath, an increasing cough, or you are coughing up blood.  You have severe back or abdominal  pain, feel nauseous, or vomit.  You develop severe weakness, fainting, or chills.  You have a fever.                                                                                                                                                                                                                           THIS IS AN EMERGENCY. Do not wait to see if the pain will go away. Get medical help at once. Call your local emergency services (911 in [place]  Do not drive yourself to the hospital. MAKE SURE YOU:   Understand these instructions.  Will watch your condition.  Will get help right away if you are not doing well or get worse. Document Released: 08/01/2005 Document Revised: 01/14/2012 Document Reviewed: 05/27/2008 [facility] Patient Information 2014 [place], [place].

## 2013-08-31 ENCOUNTER — Ambulatory Visit (INDEPENDENT_AMBULATORY_CARE_PROVIDER_SITE_OTHER): Payer: 59 | Admitting: Internal Medicine

## 2013-08-31 ENCOUNTER — Encounter: Payer: Self-pay | Admitting: Internal Medicine

## 2013-08-31 VITALS — BP 120/94 | HR 67 | Ht 63.0 in | Wt 221.0 lb

## 2013-08-31 DIAGNOSIS — I10 Essential (primary) hypertension: Secondary | ICD-10-CM

## 2013-08-31 DIAGNOSIS — R079 Chest pain, unspecified: Secondary | ICD-10-CM

## 2013-08-31 NOTE — Patient Instructions (Signed)
Your physician recommends that you schedule a follow-up appointment in: AS NEEDED PENDING TEST RESULTS  Your physician has requested that you have a stress echocardiogram. For further information please visit [email or web address]. Please follow instruction sheet as given.    

## 2013-09-01 ENCOUNTER — Encounter: Payer: Self-pay | Admitting: Internal Medicine

## 2013-09-01 NOTE — Progress Notes (Signed)
HPI Patient is a 52 yo who was referred for evaluateion of CP   The patient says the discomfort is a tight sensation.  Mid chest to L then back.  Lasts 1 to 10 min Occurs with and without activity.  Occures about 1x per week  Started 12 months ago  Appears to be coming on more often Denies reflux  Has had in past. Patient does not increased fatigue recently  She says it may be due to her thyroid  Was on replacement until recently.   No Known Allergies  Current Outpatient Prescriptions  Medication Sig Dispense Refill  . albuterol (PROVENTIL HFA;VENTOLIN HFA) 108 (90 BASE) MCG/ACT inhaler Inhale 2 puffs into the lungs every 6 (six) hours as needed for wheezing.      Marland Kitchen aspirin 81 MG tablet Take 81 mg by mouth daily.      . Canagliflozin-Metformin HCl 150-500 MG TABS Take 1 tablet by mouth daily.  30 tablet  5  . meclizine (ANTIVERT) 25 MG tablet Take 1 tablet (25 mg total) by mouth 3 (three) times daily as needed for dizziness or nausea.  30 tablet  1  . Multiple Vitamins-Minerals (MULTIVITAL PO) Take by mouth daily.      . nitroGLYCERIN (NITROSTAT) 0.4 MG SL tablet Put 1 tab under tongue for chest pain. Wait 5 min and repeat x 2 if needed before calling 911.  30 tablet  0  . rosuvastatin (CRESTOR) 40 MG tablet Take 1/2 - 1 tab po daily  30 tablet  1  . telmisartan-hydrochlorothiazide (MICARDIS HCT) 80-12.5 MG per tablet Take 1 tablet by mouth daily.  30 tablet  5   No current facility-administered medications for this visit.    Past Medical History  Diagnosis Date  . Asthma   . Hypertension   . Hypothyroid   . Other and unspecified hyperlipidemia   . Polycystic disease, ovaries   . Cough   . Type II or unspecified type diabetes mellitus without mention of complication, not stated as uncontrolled   . Fatigue   . Myalgia   . Allergy     Past Surgical History  Procedure Laterality Date  . Oophorectomy    . Abdominal hysterectomy      complete hysterectomy    Family History   Problem Relation Age of Onset  . Hypertension Mother   . Hyperlipidemia Mother   . Diabetes Father   . Heart disease Father   . Stroke Father   . Hypertension Father   . Hyperlipidemia Father   . Hypertension Brother   . Hyperlipidemia Brother   . Hypertension Brother   . Hyperlipidemia Brother   . Hypertension Brother   . Hyperlipidemia Brother     History   Social History  . Marital Status: Legally Separated    Spouse Name: N/A    Number of Children: N/A  . Years of Education: N/A   Occupational History  . Not on file.   Social History Main Topics  . Smoking status: Former Smoker -- 0.00 packs/day for 5 years  . Smokeless tobacco: Never Used  . Alcohol Use: No  . Drug Use: No  . Sexual Activity: Yes    Birth Control/ Protection: Surgical   Other Topics Concern  . Not on file   Social History Narrative   Marital Status: Geographical information systems officer)    Children: None   Pets: Cat (1) Dogs (2)    Living Situation: Lives with Darryl   Occupation: Public relations account executive  at [employer].     Education: Engineer, agricultural    Tobacco Use/Exposure:  None    Alcohol Use:  Occasional   Drug Use:  None   Diet:  Regular   Exercise:  None   Hobbies: Baking/Computers    Review of Systems:  All systems reviewed.  They are negative to the above problem except as previously stated.  Vital Signs: BP 120/94  Pulse 67  Ht 5\' 3"  (1.6 m)  Wt 221 lb (100.245 kg)  BMI 39.16 kg/m2  SpO2 97%  Physical Exam Patient is a morbidly obese 52 yo in NAD HEENT:  Normocephalic, atraumatic. EOMI, PERRLA.  Neck: JVP is normal.  No bruits.  Lungs: clear to auscultation. No rales no wheezes.  Heart: Regular rate and rhythm. Normal S1, S2. No S3.   No significant murmurs. PMI not displaced.  Abdomen:  Supple, nontender. Normal bowel sounds. No masses. No hepatomegaly.  Extremities:   Good distal pulses throughout. No lower extremity edema.  Musculoskeletal :moving all extremities.   Neuro:   alert and oriented x3.  CN II-XII grossly intact.  EKG  SB 58 bpm  T wave inversion III, V1-V3 Assessment and Plan:  1.  CP Somewhat atypical  She is not that active  Does have risk factors.   EKG hs T wave inversions  No old to compare. I would recomm stress echo to evaluate LV function and eval for ischemia. Keep on current regimen for now.    2.  HTN  Not optimally controlled  Will need to be followed  One value today.  3.  HL  Meds recently increased    4.  Morbid obesity  Counselled on wt loss.

## 2013-09-10 ENCOUNTER — Other Ambulatory Visit: Payer: Self-pay

## 2013-09-14 ENCOUNTER — Other Ambulatory Visit: Payer: Self-pay | Admitting: *Deleted

## 2013-09-14 DIAGNOSIS — E785 Hyperlipidemia, unspecified: Secondary | ICD-10-CM

## 2013-09-14 DIAGNOSIS — E119 Type 2 diabetes mellitus without complications: Secondary | ICD-10-CM

## 2013-09-14 DIAGNOSIS — E039 Hypothyroidism, unspecified: Secondary | ICD-10-CM

## 2013-09-15 ENCOUNTER — Other Ambulatory Visit: Payer: 59

## 2013-09-15 LAB — COMPREHENSIVE METABOLIC PANEL
ALT: 36 U/L — ABNORMAL HIGH (ref 0–35)
AST: 20 U/L (ref 0–37)
Albumin: 4.3 g/dL (ref 3.5–5.2)
Alkaline Phosphatase: 73 U/L (ref 39–117)
BUN: 12 mg/dL (ref 6–23)
CO2: 24 mEq/L (ref 19–32)
Calcium: 8.9 mg/dL (ref 8.4–10.5)
Chloride: 100 mEq/L (ref 96–112)
Creat: 0.74 mg/dL (ref 0.50–1.10)
Glucose, Bld: 116 mg/dL — ABNORMAL HIGH (ref 70–99)
Potassium: 3.9 mEq/L (ref 3.5–5.3)
Sodium: 135 mEq/L (ref 135–145)
Total Bilirubin: 0.4 mg/dL (ref 0.3–1.2)
Total Protein: 7.1 g/dL (ref 6.0–8.3)

## 2013-09-15 LAB — LIPID PANEL
Cholesterol: 132 mg/dL (ref 0–200)
HDL: 40 mg/dL (ref >39)
LDL Cholesterol: 70 mg/dL (ref 0–99)
Total CHOL/HDL Ratio: 3.3 Ratio
Triglycerides: 108 mg/dL (ref <150)
VLDL: 22 mg/dL (ref 0–40)

## 2013-09-16 LAB — T4, FREE: Free T4: 1.05 ng/dL (ref 0.80–1.80)

## 2013-09-16 LAB — T3, FREE: T3, Free: 3.4 pg/mL (ref 2.3–4.2)

## 2013-09-16 LAB — TSH: TSH: 1.025 u[IU]/mL (ref 0.350–4.500)

## 2013-09-17 ENCOUNTER — Other Ambulatory Visit: Payer: 59

## 2013-09-18 ENCOUNTER — Ambulatory Visit (HOSPITAL_BASED_OUTPATIENT_CLINIC_OR_DEPARTMENT_OTHER): Payer: 59

## 2013-09-18 ENCOUNTER — Encounter: Payer: Self-pay | Admitting: Cardiology

## 2013-09-18 ENCOUNTER — Ambulatory Visit (HOSPITAL_COMMUNITY): Payer: 59 | Attending: Cardiology

## 2013-09-18 DIAGNOSIS — R0989 Other specified symptoms and signs involving the circulatory and respiratory systems: Secondary | ICD-10-CM

## 2013-09-18 DIAGNOSIS — I10 Essential (primary) hypertension: Secondary | ICD-10-CM

## 2013-09-18 DIAGNOSIS — R079 Chest pain, unspecified: Secondary | ICD-10-CM

## 2013-09-18 DIAGNOSIS — R072 Precordial pain: Secondary | ICD-10-CM

## 2013-09-18 NOTE — Progress Notes (Unsigned)
Stress Echocardiogram performed by Will Edwards.

## 2013-09-22 ENCOUNTER — Ambulatory Visit (INDEPENDENT_AMBULATORY_CARE_PROVIDER_SITE_OTHER): Payer: 59 | Admitting: Family Medicine

## 2013-09-22 ENCOUNTER — Encounter: Payer: Self-pay | Admitting: Family Medicine

## 2013-09-22 ENCOUNTER — Ambulatory Visit: Payer: 59 | Admitting: Internal Medicine

## 2013-09-22 VITALS — BP 109/75 | HR 98 | Temp 98.1°F | Resp 16 | Wt 218.0 lb

## 2013-09-22 DIAGNOSIS — J45909 Unspecified asthma, uncomplicated: Secondary | ICD-10-CM

## 2013-09-22 DIAGNOSIS — B009 Herpesviral infection, unspecified: Secondary | ICD-10-CM

## 2013-09-22 DIAGNOSIS — E785 Hyperlipidemia, unspecified: Secondary | ICD-10-CM

## 2013-09-22 DIAGNOSIS — E119 Type 2 diabetes mellitus without complications: Secondary | ICD-10-CM

## 2013-09-22 DIAGNOSIS — R51 Headache: Secondary | ICD-10-CM

## 2013-09-22 DIAGNOSIS — B001 Herpesviral vesicular dermatitis: Secondary | ICD-10-CM

## 2013-09-22 LAB — POCT GLYCOSYLATED HEMOGLOBIN (HGB A1C): Hemoglobin A1C: 6.1

## 2013-09-22 MED ORDER — MONTELUKAST SODIUM 10 MG PO TABS: 10.0000 mg | ORAL_TABLET | Freq: Every day | ORAL | Status: AC

## 2013-09-22 MED ORDER — CANAGLIFLOZIN 300 MG PO TABS: 1.0000 | ORAL_TABLET | Freq: Every day | ORAL | Status: AC

## 2013-09-22 MED ORDER — PENCICLOVIR 1 % EX CREA: 1.0000 "application " | TOPICAL_CREAM | CUTANEOUS | Status: AC

## 2013-09-22 MED ORDER — METFORMIN HCL 1000 MG PO TABS: 1000.0000 mg | ORAL_TABLET | Freq: Two times a day (BID) | ORAL | Status: AC

## 2013-09-22 MED ORDER — ROSUVASTATIN CALCIUM 20 MG PO TABS: ORAL_TABLET | ORAL | Status: AC

## 2013-09-22 MED ORDER — KETOROLAC TROMETHAMINE 60 MG/2ML IM SOLN
60.0000 mg | Freq: Once | INTRAMUSCULAR | Status: AC
  Administered 2013-09-22: 60 mg via INTRAMUSCULAR

## 2013-09-22 MED ORDER — FAMCICLOVIR 500 MG PO TABS: ORAL_TABLET | ORAL | Status: AC

## 2013-09-22 MED ORDER — METHYLPREDNISOLONE SODIUM SUCC 125 MG IJ SOLR
125.0000 mg | Freq: Once | INTRAMUSCULAR | Status: AC
  Administered 2013-09-22: 125 mg via INTRAMUSCULAR

## 2013-09-22 MED ORDER — ALBUTEROL SULFATE HFA 108 (90 BASE) MCG/ACT IN AERS: 2.0000 | INHALATION_SPRAY | Freq: Four times a day (QID) | RESPIRATORY_TRACT | Status: AC | PRN

## 2013-09-22 MED ORDER — BUDESONIDE-FORMOTEROL FUMARATE 160-4.5 MCG/ACT IN AERO: 2.0000 | INHALATION_SPRAY | Freq: Two times a day (BID) | RESPIRATORY_TRACT | Status: AC

## 2013-09-22 NOTE — Progress Notes (Signed)
Subjective:    Patient ID: Lacey Hines, female    DOB: 11/03/1961, 52 y.o.   MRN: [id number]  HPI  Lacey Hines is here today to go over her most recent lab results and to discuss the conditions listed below:    1)  Type II DM:  She is doing well on Invokamet (150-500 mg).    2)  Asthma:  She needs a refill on her Albuterol inhaler.   3)  URI:  She has been struggling with URI symptoms for about two weeks.  She feels that her symptoms are moderate in nature.  She has tried several OTC medications for her symptoms but her symptoms have not improved.     4)  Headache:  She has been having a headache for several days.     Review of Systems  Constitutional: Positive for chills.  HENT: Positive for rhinorrhea and sneezing.        Facial pain  Eyes: Negative.   Respiratory: Positive for cough.   Cardiovascular: Negative.   Gastrointestinal: Negative.   Endocrine: Negative.   Genitourinary: Negative.   Musculoskeletal: Negative.   Skin: Negative.   Allergic/Immunologic: Negative.   Neurological: Positive for headaches.  Hematological: Negative.   Psychiatric/Behavioral: Negative.     No Known Allergies  Past Medical History  Diagnosis Date  . Asthma   . Hypertension   . Hypothyroid   . Other and unspecified hyperlipidemia   . Polycystic disease, ovaries   . Cough   . Type II or unspecified type diabetes mellitus without mention of complication, not stated as uncontrolled   . Fatigue   . Myalgia   . Allergy     Family History  Problem Relation Age of Onset  . Hypertension Mother   . Hyperlipidemia Mother   . Diabetes Father   . Heart disease Father   . Stroke Father   . Hypertension Father   . Hyperlipidemia Father   . Hypertension Brother   . Hyperlipidemia Brother   . Heart disease Brother   . Hypertension Brother   . Hyperlipidemia Brother   . Hypertension Brother   . Hyperlipidemia Brother     History   Social History Narrative   Marital Status: Tree surgeon)    Children: None   Pets: Cat (1) Dogs (2)    Living Situation: Lives with Darryl   Occupation: Public relations account executive at [employer].     Education: Engineer, agricultural    Tobacco Use/Exposure:  None    Alcohol Use:  Occasional   Drug Use:  None   Diet:  Regular   Exercise:  None   Hobbies: Baking/Computers    Current Outpatient Prescriptions on File Prior to Visit  Medication Sig Dispense Refill  . aspirin 81 MG tablet Take 81 mg by mouth daily.      . meclizine (ANTIVERT) 25 MG tablet Take 1 tablet (25 mg total) by mouth 3 (three) times daily as needed for dizziness or nausea.  30 tablet  1  . Multiple Vitamins-Minerals (MULTIVITAL PO) Take by mouth daily.      . nitroGLYCERIN (NITROSTAT) 0.4 MG SL tablet Put 1 tab under tongue for chest pain. Wait 5 min and repeat x 2 if needed before calling 911.  30 tablet  0   No current facility-administered medications on file prior to visit.       Objective:   Physical Exam  Constitutional: She appears well-nourished. No distress.  HENT:  Head: Normocephalic.  Mouth/Throat: No oropharyngeal exudate.  Eyes: Conjunctivae are normal. Right eye exhibits no discharge. Left eye exhibits no discharge.  Neck: Neck supple.  Cardiovascular: Normal rate, regular rhythm and normal heart sounds.  Exam reveals no gallop and no friction rub.   No murmur heard. Pulmonary/Chest: Effort normal and breath sounds normal. She has no wheezes. She exhibits no tenderness.  Lymphadenopathy:    She has no cervical adenopathy.  Neurological: She is alert.  Skin: Skin is warm and dry. No rash noted.  Psychiatric: She has a normal mood and affect.     Assessment & Plan:    Anthonia was seen today for medication management.  Diagnoses and associated orders for this visit:  Type II or unspecified type diabetes mellitus without mention of complication, not stated as uncontrolled Comments: Her A1c has improved from 6.4% to 6.1%.  We are  going to increase her Invokana to 300 mg and the metformin to 1000 mg.   - POCT glycosylated hemoglobin (Hb A1C) - Canagliflozin (INVOKANA) 300 MG TABS; Take 1 tablet (300 mg total) by mouth daily. - metFORMIN (GLUCOPHAGE) 1000 MG tablet; Take 1 tablet (1,000 mg total) by mouth 2 (two) times daily with a meal.  Unspecified asthma(493.90) Comments: She was given several new meds for her asthma.   - budesonide-formoterol (SYMBICORT) 160-4.5 MCG/ACT inhaler; Inhale 2 puffs into the lungs 2 (two) times daily. - montelukast (SINGULAIR) 10 MG tablet; Take 1 tablet (10 mg total) by mouth at bedtime. - albuterol (PROVENTIL HFA;VENTOLIN HFA) 108 (90 BASE) MCG/ACT inhaler; Inhale 2 puffs into the lungs every 6 (six) hours as needed for wheezing.  Other and unspecified hyperlipidemia Comments: Her lipid panel is much better on Crestor so she'll remain on it.   - rosuvastatin (CRESTOR) 20 MG tablet; Take 1/2 - 1 tab po daily  Fever blister - famciclovir (FAMVIR) 500 MG tablet; Take 1/2 - 1 pill daily for prevention of fever blister or 3 tabs at onset of symptoms x 1 days. - penciclovir (DENAVIR) 1 % cream; Apply 1 application topically every 2 (two) hours.  Headache(784.0) Comments: She was given injections for her headache.   - methylPREDNISolone sodium succinate (SOLU-MEDROL) 125 mg/2 mL injection 125 mg; Inject 2 mLs (125 mg total) into the muscle once. - ketorolac (TORADOL) injection 60 mg; Inject 2 mLs (60 mg total) into the muscle once.

## 2013-09-22 NOTE — Patient Instructions (Signed)
1)  Blood Sugar - Take 300 mg of Invokana in the am and 1000 of the metformin at night.  We'll recheck your A1c in 4 months.    2)  Cholesterol - 10 mg of Crestor  3)   Asthma - Symbicort 2 puffs 2  X  Day +/- Singulair +/- Albuterol  4)  Fever Blister - Famvir +/- Denavir  5)  BP - Perfect on Micardis HCT.    Asthma Attack Prevention Although there is no way to prevent asthma from starting, you can take steps to control the disease and reduce its symptoms. Learn about your asthma and how to control it. Take an active role to control your asthma by working with your health care provider to create and follow an asthma action plan. An asthma action plan guides you in:  Taking your medicines properly.  Avoiding things that set off your asthma or make your asthma worse (asthma triggers).  Tracking your level of asthma control.  Responding to worsening asthma.  Seeking emergency care when needed. To track your asthma, keep records of your symptoms, check your peak flow number using a handheld device that shows how well air moves out of your lungs (peak flow meter), and get regular asthma checkups.  WHAT ARE SOME WAYS TO PREVENT AN ASTHMA ATTACK?  Take medicines as directed by your health care provider.  Keep track of your asthma symptoms and level of control.  With your health care provider, write a detailed plan for taking medicines and managing an asthma attack. Then be sure to follow your action plan. Asthma is an ongoing condition that needs regular monitoring and treatment.  Identify and avoid asthma triggers. Many outdoor allergens and irritants (such as pollen, mold, cold air, and air pollution) can trigger asthma attacks. Find out what your asthma triggers are and take steps to avoid them.  Monitor your breathing. Learn to recognize warning signs of an attack, such as coughing, wheezing, or shortness of breath. Your lung function may decrease before you notice any signs or  symptoms, so regularly measure and record your peak airflow with a home peak flow meter.  Identify and treat attacks early. If you act quickly, you are less likely to have a severe attack. You will also need less medicine to control your symptoms. When your peak flow measurements decrease and alert you to an upcoming attack, take your medicine as instructed and immediately stop any activity that may have triggered the attack. If your symptoms do not improve, get medical help.  Pay attention to increasing quick-relief inhaler use. If you find yourself relying on your quick-relief inhaler, your asthma is not under control. See your health care provider about adjusting your treatment. WHAT CAN MAKE MY SYMPTOMS WORSE? A number of common things can set off or make your asthma symptoms worse and cause temporary increased inflammation of your airways. Keep track of your asthma symptoms for several weeks, detailing all the environmental and emotional factors that are linked with your asthma. When you have an asthma attack, go back to your asthma diary to see which factor, or combination of factors, might have contributed to it. Once you know what these factors are, you can take steps to control many of them. If you have allergies and asthma, it is important to take asthma prevention steps at home. Minimizing contact with the substance to which you are allergic will help prevent an asthma attack. Some triggers and ways to avoid these triggers are: Animal  Dander:  Some people are allergic to the flakes of skin or dried saliva from animals with fur or feathers.   There is no such thing as a hypoallergenic dog or cat breed. All dogs or cats can cause allergies, even if they don't shed.  Keep these pets out of your home.  If you are not able to keep a pet outdoors, keep the pet out of your bedroom and other sleeping areas at all times, and keep the door closed.  Remove carpets and furniture covered with cloth  from your home. If that is not possible, keep the pet away from fabric-covered furniture and carpets. Dust Mites: Many people with asthma are allergic to dust mites. Dust mites are tiny bugs that are found in every home in mattresses, pillows, carpets, fabric-covered furniture, bedcovers, clothes, stuffed toys, and other fabric-covered items.   Cover your mattress in a special dust-proof cover.  Cover your pillow in a special dust-proof cover, or wash the pillow each week in hot water. Water must be hotter than 130 F (54.4 C) to kill dust mites. Cold or warm water used with detergent and bleach can also be effective.  Wash the sheets and blankets on your bed each week in hot water.  Try not to sleep or lie on cloth-covered cushions.  Call ahead when traveling and ask for a smoke-free hotel room. Bring your own bedding and pillows in case the hotel only supplies feather pillows and down comforters, which may contain dust mites and cause asthma symptoms.  Remove carpets from your bedroom and those laid on concrete, if you can.  Keep stuffed toys out of the bed, or wash the toys weekly in hot water or cooler water with detergent and bleach. Cockroaches: Many people with asthma are allergic to the droppings and remains of cockroaches.   Keep food and garbage in closed containers. Never leave food out.  Use poison baits, traps, powders, gels, or paste (for example, boric acid).  If a spray is used to kill cockroaches, stay out of the room until the odor goes away. Indoor Mold:  Fix leaky faucets, pipes, or other sources of water that have mold around them.  Clean floors and moldy surfaces with a fungicide or diluted bleach.  Avoid using humidifiers, vaporizers, or swamp coolers. These can spread molds through the air. Pollen and Outdoor Mold:  When pollen or mold spore counts are high, try to keep your windows closed.  Stay indoors with windows closed from late morning to afternoon.  Pollen and some mold spore counts are highest at that time.  Ask your health care provider whether you need to take anti-inflammatory medicine or increase your dose of the medicine before your allergy season starts. Other Irritants to Avoid:  Tobacco smoke is an irritant. If you smoke, ask your health care provider how you can quit. Ask family members to quit smoking too. Do not allow smoking in your home or car.  If possible, do not use a wood-burning stove, kerosene heater, or fireplace. Minimize exposure to all sources of smoke, including to incense, candles, fires, and fireworks.  Try to stay away from strong odors and sprays, such as perfume, talcum powder, hair spray, and paints.  Decrease humidity in your home and use an indoor air cleaning device. Reduce indoor humidity to below 60%. Dehumidifiers or central air conditioners can do this.  Decrease house dust exposure by changing furnace and air cooler filters frequently.  Try to have someone else vacuum  for you once or twice a week. Stay out of rooms while they are being vacuumed and for a short while afterward.  If you vacuum, use a dust mask from a hardware store, a double-layered or microfilter vacuum cleaner bag, or a vacuum cleaner with a HEPA filter.  Sulfites in foods and beverages can be irritants. Do not drink beer or wine or eat dried fruit, processed potatoes, or shrimp if they cause asthma symptoms.  Cold air can trigger an asthma attack. Cover your nose and mouth with a scarf on cold or windy days.  Several health conditions can make asthma more difficult to manage, including a runny nose, sinus infections, reflux disease, psychological stress, and sleep apnea. Work with your health care provider to manage these conditions.  Avoid close contact with people who have a respiratory infection such as a cold or the flu, since your asthma symptoms may get worse if you catch the infection. Wash your hands thoroughly after  touching items that may have been handled by people with a respiratory infection.  Get a flu shot every year to protect against the flu virus, which often makes asthma worse for days or weeks. Also get a pneumonia shot if you have not previously had one. Unlike the flu shot, the pneumonia shot does not need to be given yearly. Medicines:  Talk to your health care provider about whether it is safe for you to take aspirin or non-steroidal anti-inflammatory medicines (NSAIDs). In a small number of people with asthma, aspirin and NSAIDs can cause asthma attacks. These medicines must be avoided by people who have known aspirin-sensitive asthma. It is important that people with aspirin-sensitive asthma read labels of all over-the-counter medicines used to treat pain, colds, coughs, and fever.  Beta blockers and ACE inhibitors are other medicines you should discuss with your health care provider. HOW CAN I FIND OUT WHAT I AM ALLERGIC TO? Ask your asthma health care provider about allergy skin testing or blood testing (the RAST test) to identify the allergens to which you are sensitive. If you are found to have allergies, the most important thing to do is to try to avoid exposure to any allergens that you are sensitive to as much as possible. Other treatments for allergies, such as medicines and allergy shots (immunotherapy) are available.  CAN I EXERCISE? Follow your health care provider's advice regarding asthma treatment before exercising. It is important to maintain a regular exercise program, but vigorous exercise, or exercise in cold, humid, or dry environments can cause asthma attacks, especially for those people who have exercise-induced asthma. Document Released: 10/10/2009 Document Revised: 06/24/2013 Document Reviewed: 04/29/2013 [facility] Patient Information 2014 [place], [place].

## 2013-09-29 ENCOUNTER — Other Ambulatory Visit: Payer: 59

## 2013-09-29 ENCOUNTER — Other Ambulatory Visit: Payer: Self-pay | Admitting: *Deleted

## 2013-09-29 MED ORDER — DILTIAZEM HCL ER COATED BEADS 180 MG PO CP24: 180.0000 mg | ORAL_CAPSULE | Freq: Every day | ORAL | Status: AC

## 2013-10-06 ENCOUNTER — Ambulatory Visit: Payer: 59 | Admitting: Internal Medicine

## 2013-10-23 ENCOUNTER — Encounter: Payer: Self-pay | Admitting: Internal Medicine

## 2013-10-23 ENCOUNTER — Ambulatory Visit (INDEPENDENT_AMBULATORY_CARE_PROVIDER_SITE_OTHER): Payer: 59 | Admitting: Internal Medicine

## 2013-10-23 VITALS — BP 110/70 | HR 53 | Ht 63.0 in | Wt 220.0 lb

## 2013-10-23 DIAGNOSIS — J45909 Unspecified asthma, uncomplicated: Secondary | ICD-10-CM

## 2013-10-23 DIAGNOSIS — E785 Hyperlipidemia, unspecified: Secondary | ICD-10-CM

## 2013-10-23 DIAGNOSIS — R079 Chest pain, unspecified: Secondary | ICD-10-CM

## 2013-10-23 DIAGNOSIS — E119 Type 2 diabetes mellitus without complications: Secondary | ICD-10-CM

## 2013-10-23 MED ORDER — OMEPRAZOLE 40 MG PO CPDR: 40.0000 mg | DELAYED_RELEASE_CAPSULE | Freq: Every day | ORAL | Status: AC

## 2013-10-23 NOTE — Patient Instructions (Addendum)
Your physician wants you to follow-up in: 9 MONTHS WITH DR Tenny Craw You will receive a reminder letter in the mail two months in advance. If you don't receive a letter, please call our office to schedule the follow-up appointment.   START OMEPRAZOLE 40 MG ONCE DAILY

## 2013-10-23 NOTE — Progress Notes (Signed)
HPI  Patient is a 52 yo who I saw earlier this fall for CP   I recomm a stress echo to evaluate.  This showed hyperdynamic function with no wall motion abnormalities After test I recomm stiching from Diovan HCTZ to Cardiazem.   Since doing this she has felt OK  Denies CP  Breathing she thinks is a little better.   No dizziness.    She curr has a cold   No Known Allergies  Current Outpatient Prescriptions  Medication Sig Dispense Refill  . albuterol (PROVENTIL HFA;VENTOLIN HFA) 108 (90 BASE) MCG/ACT inhaler Inhale 2 puffs into the lungs every 6 (six) hours as needed for wheezing.  1 Inhaler  11  . aspirin 81 MG tablet Take 81 mg by mouth daily.      . budesonide-formoterol (SYMBICORT) 160-4.5 MCG/ACT inhaler Inhale 2 puffs into the lungs 2 (two) times daily.  1 Inhaler  11  . diltiazem (CARDIZEM CD) 180 MG 24 hr capsule Take 1 capsule (180 mg total) by mouth daily.  90 capsule  3  . famciclovir (FAMVIR) 500 MG tablet Take 1/2 - 1 pill daily for prevention of fever blister or 3 tabs at onset of symptoms x 1 days.  30 tablet  11  . INVOKANA 300 MG TABS daily      . meclizine (ANTIVERT) 25 MG tablet Take 1 tablet (25 mg total) by mouth 3 (three) times daily as needed for dizziness or nausea.  30 tablet  1  . metFORMIN (GLUCOPHAGE) 1000 MG tablet Take 1 tablet (1,000 mg total) by mouth 2 (two) times daily with a meal.  60 tablet  5  . montelukast (SINGULAIR) 10 MG tablet Take 1 tablet (10 mg total) by mouth at bedtime.  30 tablet  11  . Multiple Vitamins-Minerals (MULTIVITAL PO) Take by mouth daily.      . nitroGLYCERIN (NITROSTAT) 0.4 MG SL tablet Put 1 tab under tongue for chest pain. Wait 5 min and repeat x 2 if needed before calling 911.  30 tablet  0  . penciclovir (DENAVIR) 1 % cream Apply 1 application topically every 2 (two) hours.  5 g  11  . rosuvastatin (CRESTOR) 20 MG tablet Take 1/2 - 1 tab po daily  30 tablet  5   No current facility-administered medications for this visit.     Past Medical History  Diagnosis Date  . Asthma   . Hypertension   . Hypothyroid   . Other and unspecified hyperlipidemia   . Polycystic disease, ovaries   . Cough   . Type II or unspecified type diabetes mellitus without mention of complication, not stated as uncontrolled   . Fatigue   . Myalgia   . Allergy     Past Surgical History  Procedure Laterality Date  . Oophorectomy    . Abdominal hysterectomy      complete hysterectomy    Family History  Problem Relation Age of Onset  . Hypertension Mother   . Hyperlipidemia Mother   . Diabetes Father   . Heart disease Father   . Stroke Father   . Hypertension Father   . Hyperlipidemia Father   . Hypertension Brother   . Hyperlipidemia Brother   . Heart disease Brother   . Hypertension Brother   . Hyperlipidemia Brother   . Hypertension Brother   . Hyperlipidemia Brother     History   Social History  . Marital Status: Legally Separated    Spouse Name: N/A  Number of Children: N/A  . Years of Education: N/A   Occupational History  . Not on file.   Social History Main Topics  . Smoking status: Former Smoker -- 0.00 packs/day for 5 years  . Smokeless tobacco: Never Used  . Alcohol Use: No  . Drug Use: No  . Sexual Activity: Yes    Birth Control/ Protection: Surgical   Other Topics Concern  . 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  They are negative to the above problem except as previously stated.  Vital Signs: BP 110/70  Pulse 53  Ht 5\' 3"  (1.6 m)  Wt 220 lb (99.791 kg)  BMI 38.98  kg/m2  Physical Exam Patient is a morbidly obese 52 yo in NAD HEENT:  Normocephalic, atraumatic. EOMI, PERRLA.  Neck: JVP is normal.  No bruits.  Lungs: clear to auscultation. Mild wheeze in upper airway Heart: Regular rate and rhythm. Normal S1, S2. No S3.   No significant murmurs. PMI not displaced.  Abdomen:  Supple, nontender. Normal bowel sounds. No masses. No hepatomegaly.  Extremities:   Good distal pulses throughout. No lower extremity edema.  Musculoskeletal :moving all extremities.  Neuro:   alert and oriented x3.  CN II-XII grossly intact.  EKG  SB 58 bpm  T wave inversion III, V1-V3 Assessment and Plan:  1.  CP No evid on stress echo for ischemia.  Patient doing well on current regimen  I would continue.  2.  HTN Adequate vcontrol  3.  HL COntinue meds  5  Asthma  Recently switched inhaler.  I would adding omeprazole for reflux  4.  Morbid obesity   Discussed wt loss.

## 2013-10-26 ENCOUNTER — Ambulatory Visit: Payer: 59 | Admitting: Internal Medicine

## 2013-12-09 NOTE — Addendum Note (Signed)
Addended by: Claudie ReveringKARAM, Daja Shuping M on: 12/09/2013 12:33 PM   Modules accepted: Level of Service

## 2014-01-25 ENCOUNTER — Ambulatory Visit (INDEPENDENT_AMBULATORY_CARE_PROVIDER_SITE_OTHER): Payer: 59 | Admitting: Family Medicine

## 2014-01-25 ENCOUNTER — Ambulatory Visit (HOSPITAL_BASED_OUTPATIENT_CLINIC_OR_DEPARTMENT_OTHER)
Admission: RE | Admit: 2014-01-25 | Discharge: 2014-01-25 | Disposition: A | Discharge status: Home / Self Care | Payer: 59 | Source: Ambulatory Visit | Attending: Family Medicine | Admitting: Family Medicine

## 2014-01-25 ENCOUNTER — Encounter: Payer: Self-pay | Admitting: Family Medicine

## 2014-01-25 VITALS — BP 135/106 | HR 58 | Temp 98.5°F | Resp 16 | Wt 208.0 lb

## 2014-01-25 DIAGNOSIS — R0989 Other specified symptoms and signs involving the circulatory and respiratory systems: Secondary | ICD-10-CM | POA: Insufficient documentation

## 2014-01-25 DIAGNOSIS — R05 Cough: Secondary | ICD-10-CM | POA: Insufficient documentation

## 2014-01-25 DIAGNOSIS — R51 Headache: Secondary | ICD-10-CM

## 2014-01-25 DIAGNOSIS — R070 Pain in throat: Secondary | ICD-10-CM

## 2014-01-25 DIAGNOSIS — R059 Cough, unspecified: Secondary | ICD-10-CM | POA: Insufficient documentation

## 2014-01-25 MED ORDER — METHYLPREDNISOLONE SODIUM SUCC 125 MG IJ SOLR
125.0000 mg | Freq: Once | INTRAMUSCULAR | Status: AC
  Administered 2014-01-25: 125 mg via INTRAMUSCULAR

## 2014-01-25 MED ORDER — KETOROLAC TROMETHAMINE 60 MG/2ML IM SOLN
60.0000 mg | Freq: Once | INTRAMUSCULAR | Status: AC
  Administered 2014-01-25: 60 mg via INTRAMUSCULAR

## 2014-01-25 MED ORDER — AZITHROMYCIN 500 MG PO TABS: 500.0000 mg | ORAL_TABLET | Freq: Every day | ORAL | Status: AC

## 2014-01-25 MED ORDER — HYDROCOD POLST-CHLORPHEN POLST 10-8 MG/5ML PO LQCR: 5.0000 mL | Freq: Two times a day (BID) | ORAL | Status: AC | PRN

## 2014-01-25 MED ORDER — BENZONATATE 200 MG PO CAPS: 200.0000 mg | ORAL_CAPSULE | Freq: Three times a day (TID) | ORAL | Status: AC | PRN

## 2014-01-25 NOTE — Progress Notes (Signed)
Subjective:    Patient ID: Lacey Hines, female    DOB: [birth year], 53 y.o.   MRN: [id number]  Lacey Hines is here today complaining of URI symptoms including sore throat, chest congestion and cough.  She has been sick off and on for the past several weeks with the congestion and cough and her sore throat developed two days ago.  She also has been feeling febrile but she has not measure her body temperature.    Sore Throat  This is a new problem. The current episode started in the past 7 days. The problem has been waxing and waning. The pain is at a severity of 5/10. The pain is moderate. Associated symptoms include coughing (non productive), ear pain (right), headaches, a hoarse voice and trouble swallowing. Treatments tried: Coricidin  The treatment provided mild relief.    Review of Systems  HENT: Positive for ear pain (right), hoarse voice and trouble swallowing.   Respiratory: Positive for cough (non productive).   Musculoskeletal: Positive for back pain (mid-back).  Neurological: Positive for headaches.     Past Medical History  Diagnosis Date  . Asthma   . Hypertension   . Hypothyroid   . Other and unspecified hyperlipidemia   . Polycystic disease, ovaries   . Cough   . Type II or unspecified type diabetes mellitus without mention of complication, not stated as uncontrolled   . Fatigue   . Myalgia   . Allergy      Past Surgical History  Procedure Laterality Date  . Oophorectomy    . Abdominal hysterectomy      complete hysterectomy     History   Social History Narrative   Marital Status: Geographical information systems officer)    Children: None   Pets: Cat (1) Dogs (2)    Living Situation: Lives with Darryl   Occupation: Public relations account executive at [employer].     Education: Engineer, agricultural    Tobacco Use/Exposure:  None    Alcohol Use:  Occasional   Drug Use:  None   Diet:  Regular   Exercise:  None   Hobbies: Baking/Computers     Family History  Problem  Relation Age of Onset  . Hypertension Mother   . Hyperlipidemia Mother   . Diabetes Father   . Heart disease Father   . Stroke Father   . Hypertension Father   . Hyperlipidemia Father   . Hypertension Brother   . Hyperlipidemia Brother   . Heart disease Brother   . Hypertension Brother   . Hyperlipidemia Brother   . Hypertension Brother   . Hyperlipidemia Brother      Current Outpatient Prescriptions on File Prior to Visit  Medication Sig Dispense Refill  . albuterol (PROVENTIL HFA;VENTOLIN HFA) 108 (90 BASE) MCG/ACT inhaler Inhale 2 puffs into the lungs every 6 (six) hours as needed for wheezing.  1 Inhaler  11  . aspirin 81 MG tablet Take 81 mg by mouth daily.      . budesonide-formoterol (SYMBICORT) 160-4.5 MCG/ACT inhaler Inhale 2 puffs into the lungs 2 (two) times daily.  1 Inhaler  11  . diltiazem (CARDIZEM CD) 180 MG 24 hr capsule Take 1 capsule (180 mg total) by mouth daily.  90 capsule  3  . INVOKANA 300 MG TABS daily      . meclizine (ANTIVERT) 25 MG tablet Take 1 tablet (25 mg total) by mouth 3 (three) times daily as needed for dizziness or nausea.  30 tablet  1  .  metFORMIN (GLUCOPHAGE) 1000 MG tablet Take 1 tablet (1,000 mg total) by mouth 2 (two) times daily with a meal.  60 tablet  5  . montelukast (SINGULAIR) 10 MG tablet Take 1 tablet (10 mg total) by mouth at bedtime.  30 tablet  11  . Multiple Vitamins-Minerals (MULTIVITAL PO) Take by mouth daily.      . nitroGLYCERIN (NITROSTAT) 0.4 MG SL tablet Put 1 tab under tongue for chest pain. Wait 5 min and repeat x 2 if needed before calling 911.  30 tablet  0  . omeprazole (PRILOSEC) 40 MG capsule Take 1 capsule (40 mg total) by mouth daily.  30 capsule  12  . penciclovir (DENAVIR) 1 % cream Apply 1 application topically every 2 (two) hours.  5 g  11  . rosuvastatin (CRESTOR) 20 MG tablet Take 1/2 - 1 tab po daily  30 tablet  5  . famciclovir (FAMVIR) 500 MG tablet Take 1/2 - 1 pill daily for prevention of fever blister  or 3 tabs at onset of symptoms x 1 days.  30 tablet  11   No current facility-administered medications on file prior to visit.     No Known Allergies   Immunization History  Administered Date(s) Administered  . Tdap 06/15/2013       Objective:   Physical Exam  Nursing note and vitals reviewed. Constitutional: She appears well-nourished. No distress.  HENT:  Head: Normocephalic.  Mouth/Throat: No oropharyngeal exudate.  Eyes: Conjunctivae are normal. Right eye exhibits no discharge. Left eye exhibits no discharge.  Neck: Neck supple.  Cardiovascular: Normal rate, regular rhythm and normal heart sounds.  Exam reveals no gallop and no friction rub.   No murmur heard. Pulmonary/Chest: Effort normal and breath sounds normal. She has no wheezes. She exhibits no tenderness.  Lymphadenopathy:    She has no cervical adenopathy.  Neurological: She is alert.  Skin: Skin is warm and dry. No rash noted.  Psychiatric: She has a normal mood and affect.      Assessment & Plan:    [name] was seen today for sore throat and cough.  Diagnoses and associated orders for this visit:  Throat pain - POCT rapid strep A (Negative)  Cough - DG Chest 2 View; (No acute disease)  - azithromycin (ZITHROMAX) 500 MG tablet; Take 1 tablet (500 mg total) by mouth daily. Take on an empty stomach. - benzonatate (TESSALON) 200 MG capsule; Take 1 capsule (200 mg total) by mouth 3 (three) times daily as needed for cough. - chlorpheniramine-HYDROcodone (TUSSIONEX PENNKINETIC ER) 10-8 MG/5ML LQCR; Take 5 mLs by mouth every 12 (twelve) hours as needed. - methylPREDNISolone sodium succinate (SOLU-MEDROL) 125 mg/2 mL injection 125 mg; Inject 2 mLs (125 mg total) into the muscle once.  Headache(784.0) - ketorolac (TORADOL) injection 60 mg; Inject 2 mLs (60 mg total) into the muscle once.

## 2014-03-20 ENCOUNTER — Other Ambulatory Visit: Payer: Self-pay | Admitting: Internal Medicine

## 2014-05-04 IMAGING — CR DG CHEST 2V
2 series · 2 of 2 positions shown · non-contrast
Comparison: None.

CLINICAL DATA: Cough and congestion

EXAM:
CHEST  2 VIEW

[w chest pa]
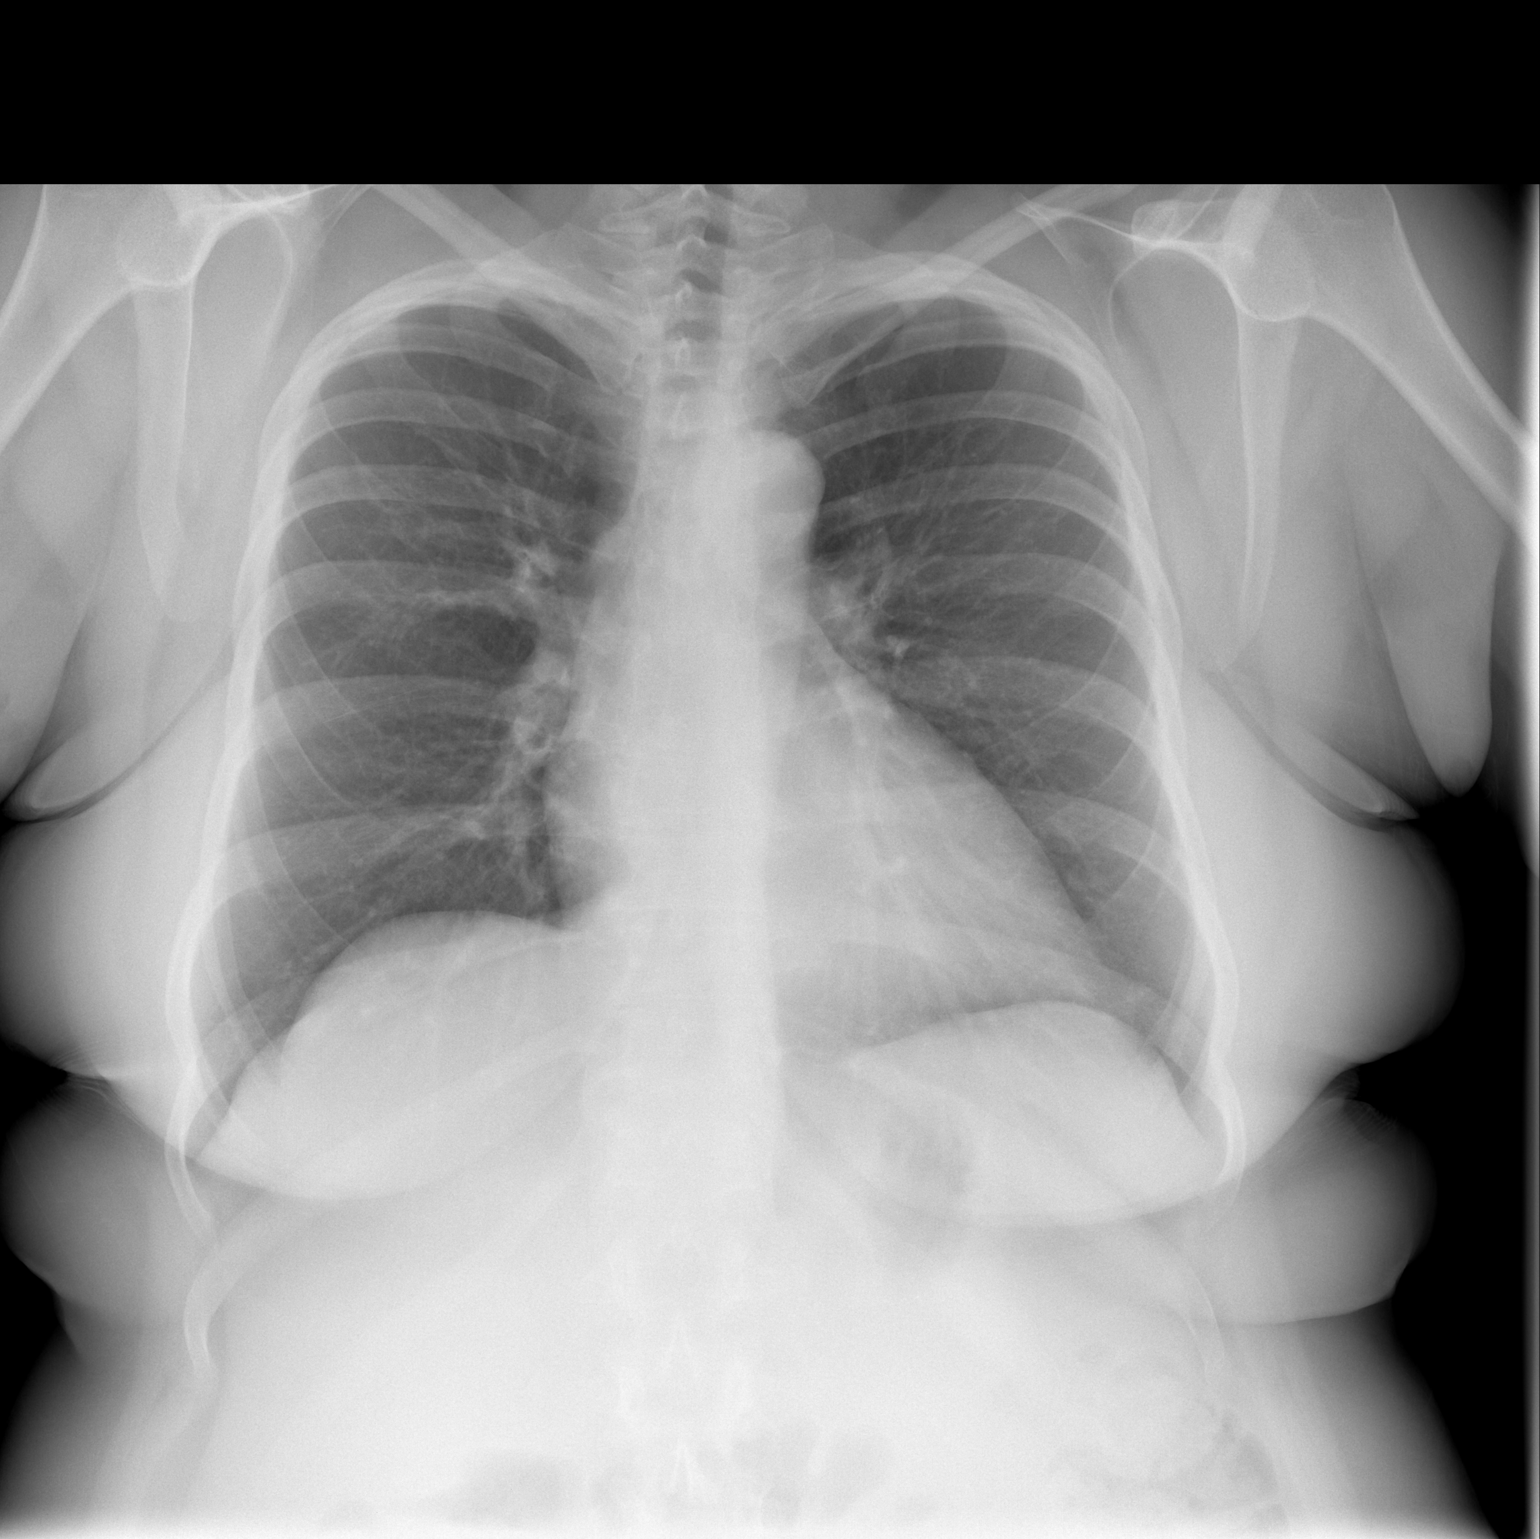

[w chest lat]
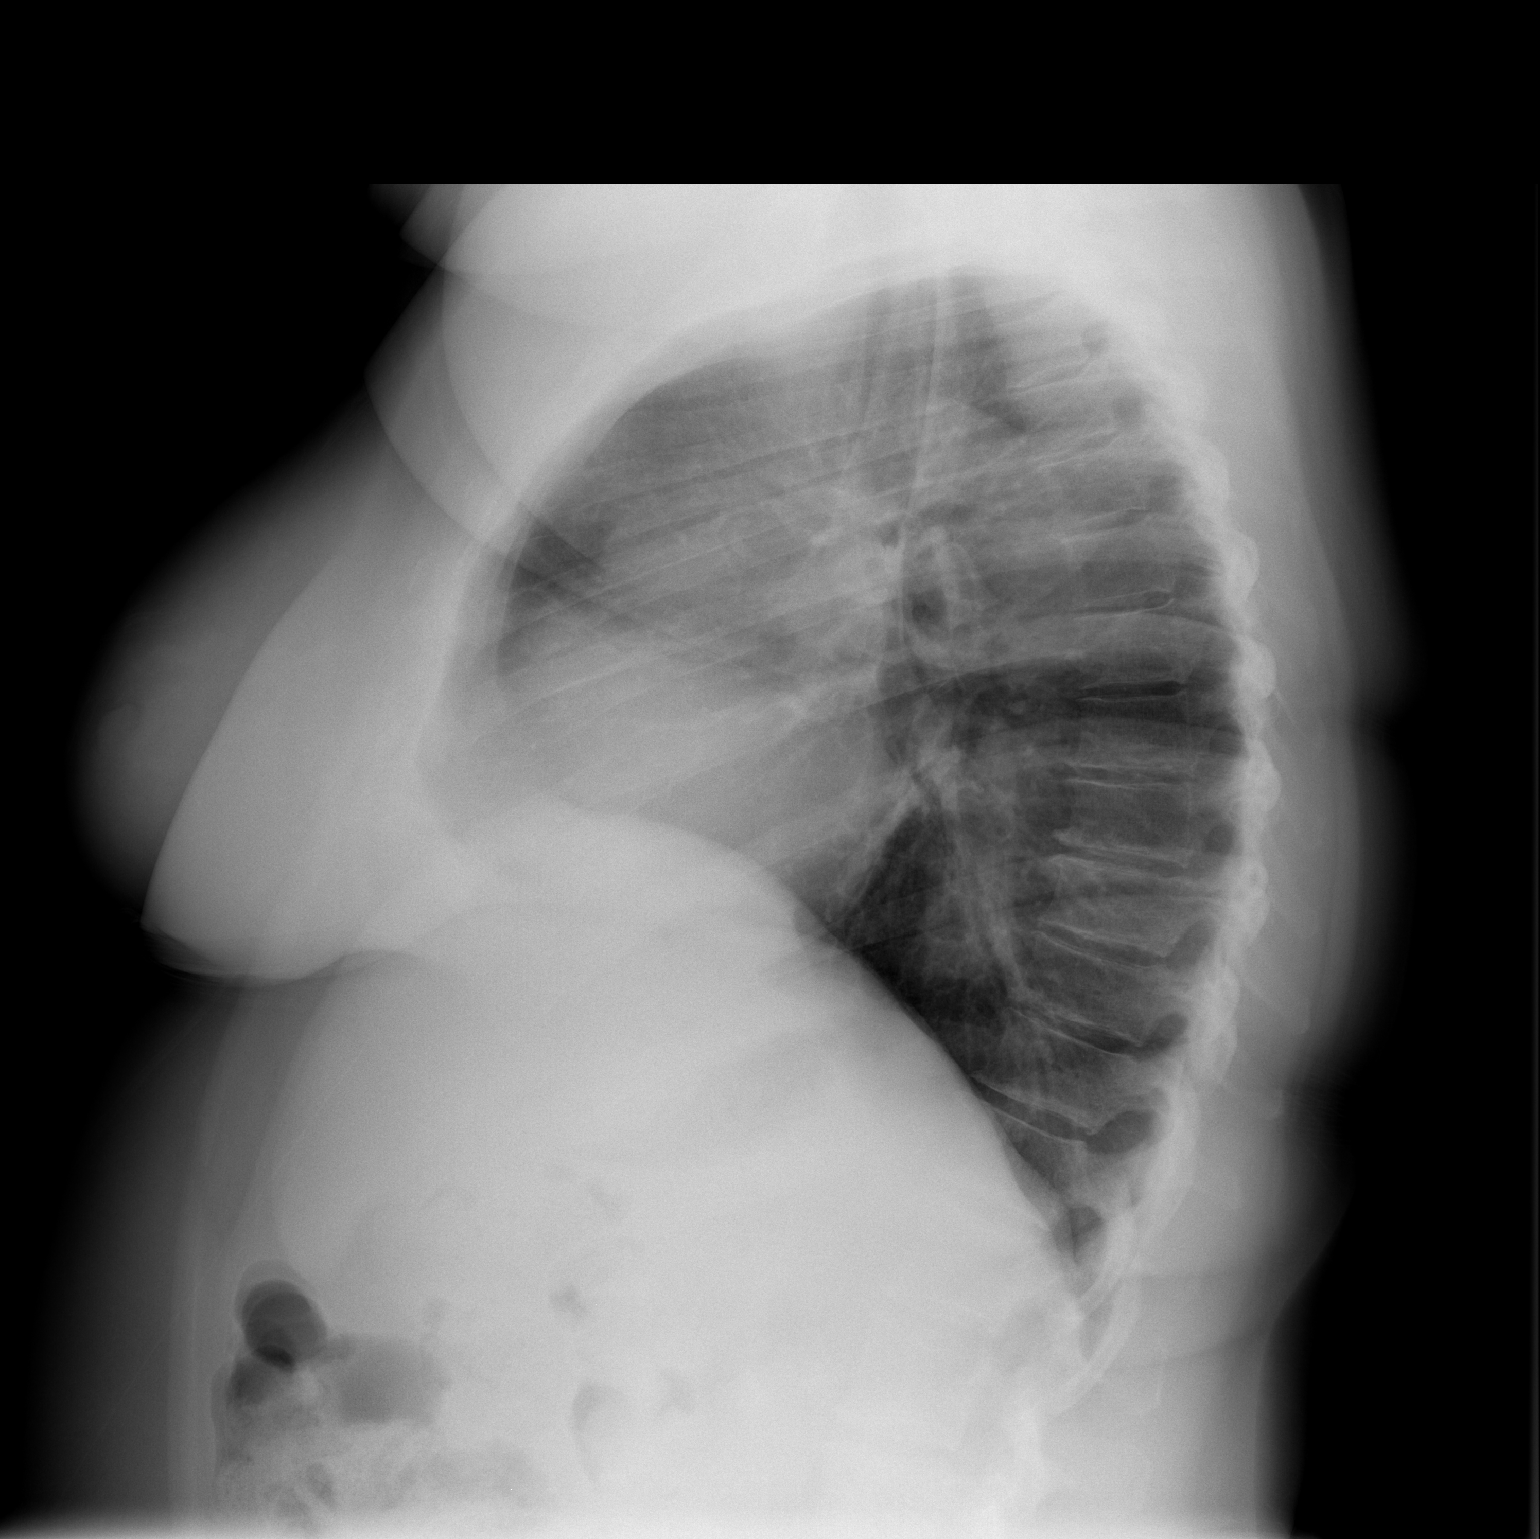

[2 of 2 positions shown; findings below may reference images not displayed]

FINDINGS: Normal mediastinum and cardiac silhouette. Normal pulmonary
vasculature. No evidence of effusion, infiltrate, or pneumothorax.
No acute bony abnormality.
IMPRESSION: No acute cardiopulmonary process.

## 2014-09-06 ENCOUNTER — Encounter (HOSPITAL_COMMUNITY): Payer: Self-pay | Admitting: Emergency Medicine

## 2014-09-06 ENCOUNTER — Emergency Department (HOSPITAL_COMMUNITY)
Admission: EM | Admit: 2014-09-06 | Discharge: 2014-09-06 | Disposition: A | Discharge status: Home / Self Care | Payer: 59 | Attending: Emergency Medicine | Admitting: Emergency Medicine

## 2014-09-06 ENCOUNTER — Emergency Department (HOSPITAL_COMMUNITY): Payer: 59

## 2014-09-06 DIAGNOSIS — Z87828 Personal history of other (healed) physical injury and trauma: Secondary | ICD-10-CM | POA: Insufficient documentation

## 2014-09-06 DIAGNOSIS — E785 Hyperlipidemia, unspecified: Secondary | ICD-10-CM | POA: Diagnosis not present

## 2014-09-06 DIAGNOSIS — S91209A Unspecified open wound of unspecified toe(s) with damage to nail, initial encounter: Secondary | ICD-10-CM | POA: Insufficient documentation

## 2014-09-06 DIAGNOSIS — S99922A Unspecified injury of left foot, initial encounter: Secondary | ICD-10-CM | POA: Diagnosis present

## 2014-09-06 DIAGNOSIS — I10 Essential (primary) hypertension: Secondary | ICD-10-CM | POA: Insufficient documentation

## 2014-09-06 DIAGNOSIS — S92912A Unspecified fracture of left toe(s), initial encounter for closed fracture: Secondary | ICD-10-CM | POA: Diagnosis not present

## 2014-09-06 DIAGNOSIS — Z79899 Other long term (current) drug therapy: Secondary | ICD-10-CM | POA: Insufficient documentation

## 2014-09-06 DIAGNOSIS — J45909 Unspecified asthma, uncomplicated: Secondary | ICD-10-CM | POA: Diagnosis not present

## 2014-09-06 DIAGNOSIS — Z87891 Personal history of nicotine dependence: Secondary | ICD-10-CM | POA: Diagnosis not present

## 2014-09-06 DIAGNOSIS — Z7982 Long term (current) use of aspirin: Secondary | ICD-10-CM | POA: Diagnosis not present

## 2014-09-06 DIAGNOSIS — W010XXA Fall on same level from slipping, tripping and stumbling without subsequent striking against object, initial encounter: Secondary | ICD-10-CM | POA: Diagnosis not present

## 2014-09-06 DIAGNOSIS — Y9301 Activity, walking, marching and hiking: Secondary | ICD-10-CM | POA: Insufficient documentation

## 2014-09-06 DIAGNOSIS — E119 Type 2 diabetes mellitus without complications: Secondary | ICD-10-CM | POA: Insufficient documentation

## 2014-09-06 DIAGNOSIS — Y9289 Other specified places as the place of occurrence of the external cause: Secondary | ICD-10-CM | POA: Diagnosis not present

## 2014-09-06 DIAGNOSIS — IMO0002 Reserved for concepts with insufficient information to code with codable children: Secondary | ICD-10-CM

## 2014-09-06 MED ORDER — IBUPROFEN 200 MG PO TABS
600.0000 mg | ORAL_TABLET | Freq: Once | ORAL | Status: AC
  Administered 2014-09-06: 600 mg via ORAL
  Filled 2014-09-06: qty 3

## 2014-09-06 MED ORDER — IBUPROFEN 600 MG PO TABS: 600.0000 mg | ORAL_TABLET | Freq: Four times a day (QID) | ORAL | Status: DC | PRN

## 2014-09-06 MED ORDER — HYDROCODONE-ACETAMINOPHEN 5-325 MG PO TABS: 1.0000 | ORAL_TABLET | Freq: Four times a day (QID) | ORAL | Status: AC | PRN

## 2014-09-06 MED ORDER — HYDROGEN PEROXIDE 3 % EX SOLN
CUTANEOUS | Status: AC
  Administered 2014-09-06: 21:00:00
  Filled 2014-09-06: qty 473

## 2014-09-06 NOTE — Discharge Instructions (Signed)
Buddy Taping of Toes We have taped your toes together to keep them from moving. This is called "buddy taping" since we used a part of your own body to keep the injured part still. We placed soft padding between your toes to keep them from rubbing against each other. Buddy taping will help with healing and to reduce pain. Keep your toes buddy taped together for as long as directed by your caregiver. HOME CARE INSTRUCTIONS   Raise your injured area above the level of your heart while sitting or lying down. Prop it up with pillows.  An ice pack used every twenty minutes, while awake, for the first one to two days may be helpful. Put ice in a plastic bag and put a towel between the bag and your skin.  Watch for signs that the taping is too tight. These signs may be:  Numbness of your taped toes.  Coolness of your taped toes.  Color change in the area beyond the tape.  Increased pain.  If you have any of these signs, loosen or rewrap the tape. If you need to loosen or rewrap the buddy tape, make sure you use the padding again. SEEK IMMEDIATE MEDICAL CARE IF:   You have worse pain, swelling, inflammation (soreness), drainage or bleeding after you rewrap the tape.  Any new problems occur. MAKE SURE YOU:   Understand these instructions.  Will watch your condition.  Will get help right away if you are not doing well or get worse. Document Released: 07/26/2004 Document Revised: 01/14/2012 Document Reviewed: 10/19/2008 [facility] Patient Information 2015 [employer] [place]. This information is not intended to replace advice given to you by your health care provider. Make sure you discuss any questions you have with your health care provider. You have a fracture at the very tip of your toe  This has been buddy tape to the other for support  You nail has also been partially lifted off the nail bed. The nail has been left in place to act as a natural bandage and protection for the nail bed

## 2014-09-06 NOTE — ED Notes (Signed)
Patient transported to X-ray via w/c 

## 2014-09-06 NOTE — ED Notes (Addendum)
Patient states she was walking in the dark and misjudged the step, patient states she did not fall but jammed her toes on the right foot. Dried blood noted to right great toe. Patient was initially able to ambulate, states she is unsure if she can walk now due to pain.

## 2014-09-06 NOTE — ED Provider Notes (Signed)
CSN: [id number]     Arrival date & time 09/06/14  1834 History  This chart was scribed for non-physician practitioner working with Linwood DibblesJon Knapp, MD by Elveria Risingimelie Horne, ED Scribe. This patient was seen in room WTR6/WTR6 and the patient's care was started at 8:13 PM.   Chief Complaint  Patient presents with  . Fall  . Toe Injury   The history is provided by the patient. No language interpreter was used.   HPI Comments: Lacey Hines is a 53 y.o. female who presents to the Emergency Department with a right great toe injury incurred as result of falling today. Patient reports walking out of her garage. Clumsily, she did not to clear the step and fell onto the deck. Patient denies striking her head during her descent. Patient is now complaining of right great toe pain, right second toe pain and reports partial detachement of her great toe nail. Patient reports pain throughout her right leg extending to the hip due to jamming her toe. Patient denies other injuries. Patient dates last Tetanus one year ago.    Past Medical History  Diagnosis Date  . Asthma   . Hypertension   . Hypothyroid   . Other and unspecified hyperlipidemia   . Polycystic disease, ovaries   . Cough   . Type II or unspecified type diabetes mellitus without mention of complication, not stated as uncontrolled   . Fatigue   . Myalgia   . Allergy    Past Surgical History  Procedure Laterality Date  . Oophorectomy    . Abdominal hysterectomy      complete hysterectomy   Family History  Problem Relation Age of Onset  . Hypertension Mother   . Hyperlipidemia Mother   . Diabetes Father   . Heart disease Father   . Stroke Father   . Hypertension Father   . Hyperlipidemia Father   . Hypertension Brother   . Hyperlipidemia Brother   . Heart disease Brother   . Hypertension Brother   . Hyperlipidemia Brother   . Hypertension Brother   . Hyperlipidemia Brother    History  Substance Use Topics  . Smoking status: Former  Smoker -- 0.00 packs/day for 5 years  . Smokeless tobacco: Never Used  . Alcohol Use: No   OB History    No data available     Review of Systems  Constitutional: Negative for fever and chills.  Musculoskeletal: Positive for arthralgias.  Skin: Positive for wound.  Neurological: Negative for weakness and numbness.  All other systems reviewed and are negative.   Allergies  Review of patient's allergies indicates no known allergies.  Home Medications   Prior to Admission medications   Medication Sig Start Date End Date Taking? Authorizing Provider  albuterol (PROVENTIL HFA;VENTOLIN HFA) 108 (90 BASE) MCG/ACT inhaler Inhale 2 puffs into the lungs every 6 (six) hours as needed for wheezing. 09/22/13  Yes Gillian Scarceobyn K Zanard, MD  aspirin 81 MG tablet Take 81 mg by mouth daily.   Yes Historical Provider, MD  metFORMIN (GLUCOPHAGE) 1000 MG tablet Take 1 tablet (1,000 mg total) by mouth 2 (two) times daily with a meal. 09/22/13 09/22/14 Yes Gillian Scarceobyn K Zanard, MD  Multiple Vitamins-Minerals (MULTIVITAL PO) Take by mouth daily.   Yes Historical Provider, MD  benzonatate (TESSALON) 200 MG capsule Take 1 capsule (200 mg total) by mouth 3 (three) times daily as needed for cough. 01/25/14 01/26/15  Gillian Scarceobyn K Zanard, MD  budesonide-formoterol (SYMBICORT) 160-4.5 MCG/ACT inhaler Inhale 2 puffs into  the lungs 2 (two) times daily. 09/22/13 09/22/14  Gillian Scarceobyn K Zanard, MD  chlorpheniramine-HYDROcodone (TUSSIONEX PENNKINETIC ER) 10-8 MG/5ML LQCR Take 5 mLs by mouth every 12 (twelve) hours as needed. 01/25/14   Gillian Scarceobyn K Zanard, MD  diltiazem (CARDIZEM CD) 180 MG 24 hr capsule Take 1 capsule (180 mg total) by mouth daily. 09/29/13   Pricilla RifflePaula Ross V, MD  famciclovir (FAMVIR) 500 MG tablet Take 1/2 - 1 pill daily for prevention of fever blister or 3 tabs at onset of symptoms x 1 days. Patient taking differently: Take 250-1,500 mg by mouth as directed. for prevention of fever blister or 3 tabs at onset of symptoms x 1 days.  09/22/13 09/22/14  Gillian Scarceobyn K Zanard, MD  HYDROcodone-acetaminophen (NORCO/VICODIN) 5-325 MG per tablet Take 1 tablet by mouth every 6 (six) hours as needed for moderate pain. 09/06/14   Arman FilterGail K Monquie Fulgham, NP  ibuprofen (ADVIL,MOTRIN) 600 MG tablet Take 1 tablet (600 mg total) by mouth every 6 (six) hours as needed. 09/06/14   Arman FilterGail K Shaun Runyon, NP  INVOKANA 300 MG TABS daily 10/03/13   Historical Provider, MD  montelukast (SINGULAIR) 10 MG tablet Take 1 tablet (10 mg total) by mouth at bedtime. 09/22/13 09/22/14  Gillian Scarceobyn K Zanard, MD  nitroGLYCERIN (NITROSTAT) 0.4 MG SL tablet Put 1 tab under tongue for chest pain. Wait 5 min and repeat x 2 if needed before calling 911. 08/20/13 08/20/14  Gillian Scarceobyn K Zanard, MD  omeprazole (PRILOSEC) 40 MG capsule Take 1 capsule (40 mg total) by mouth daily. 10/23/13   Pricilla RifflePaula Ross V, MD  penciclovir (DENAVIR) 1 % cream Apply 1 application topically every 2 (two) hours. Patient taking differently: Apply 1 application topically every 2 (two) hours as needed.  09/22/13 09/22/14  Gillian Scarceobyn K Zanard, MD  rosuvastatin (CRESTOR) 20 MG tablet Take 1/2 - 1 tab po daily 09/22/13 09/22/14  Gillian Scarceobyn K Zanard, MD   Triage Vitals: BP 158/107 mmHg  Pulse 67  Temp(Src) 98.3 F (36.8 C) (Oral)  Resp 20  SpO2 96%   Physical Exam  Constitutional: She is oriented to person, place, and time. She appears well-developed and well-nourished.  HENT:  Head: Normocephalic and atraumatic.  Mouth/Throat: Oropharynx is clear and moist.  Eyes: Conjunctivae and EOM are normal. Pupils are equal, round, and reactive to light.  Neck: Normal range of motion.  Cardiovascular: Normal rate, regular rhythm and normal heart sounds.   Pulmonary/Chest: Effort normal and breath sounds normal.  Abdominal: Soft. Bowel sounds are normal.  Musculoskeletal: Normal range of motion.  Neurological: She is alert and oriented to person, place, and time.  Skin: Skin is warm and dry.  Psychiatric: She has a normal mood and affect.   Nursing note and vitals reviewed.   ED Course  Procedures (including critical care time)  COORDINATION OF CARE: 8:20 PM- Discussed treatment plan with patient at bedside and patient agreed to plan.   Labs Review Labs Reviewed - No data to display  Imaging Review Dg Foot Complete Right  09/06/2014   CLINICAL DATA:  Patient stumbled tonight and fell striking his right great toe and second toe. Great toe nail is mostly the avulsed.  EXAM: RIGHT FOOT COMPLETE - 3+ VIEW  COMPARISON:  None.  FINDINGS: On the lateral view only, there is a small avulsion fracture from what appears to be the dorsal aspect of the great toe distal phalanx, across the dorsal distal tuft, without displacement or comminution.  No other evidence of a fracture. Joints are normally  spaced and aligned.  IMPRESSION: Nondisplaced fracture across the dorsal aspect of the distal tuft of the distal phalanx of the right great toe. No other fracture. No joint abnormality.   Electronically Signed   By: Amie Portland M.D.   On: 09/06/2014 20:09     EKG Interpretation None     Small non displaced tufts fracture of L great toe  Nail has partially lifted off bed but is in palace around the cuticles  Will leave in place, buddy tape toes  MDM   Final diagnoses:  Toe fracture, left, closed, initial encounter  Avulsion of nail       I personally performed the services described in this documentation, which was scribed in my presence. The recorded information has been reviewed and is accurate.    Arman Filter, NP 09/06/14 2147

## 2014-09-06 NOTE — ED Notes (Signed)
Pt miss judged when walking out of garage and fell on the deck. Pt c/o right big toe pain and second toe pain. Pt states that her big toe nail is barely hanging on.

## 2014-12-14 IMAGING — CR DG FOOT COMPLETE 3+V*R*
3 series · 3 of 3 positions shown · non-contrast
Comparison: None.

CLINICAL DATA: Patient stumbled tonight and fell striking his right
great toe and second toe. Great toe nail is mostly the avulsed.

EXAM:
RIGHT FOOT COMPLETE - 3+ VIEW

[x foot ap right]
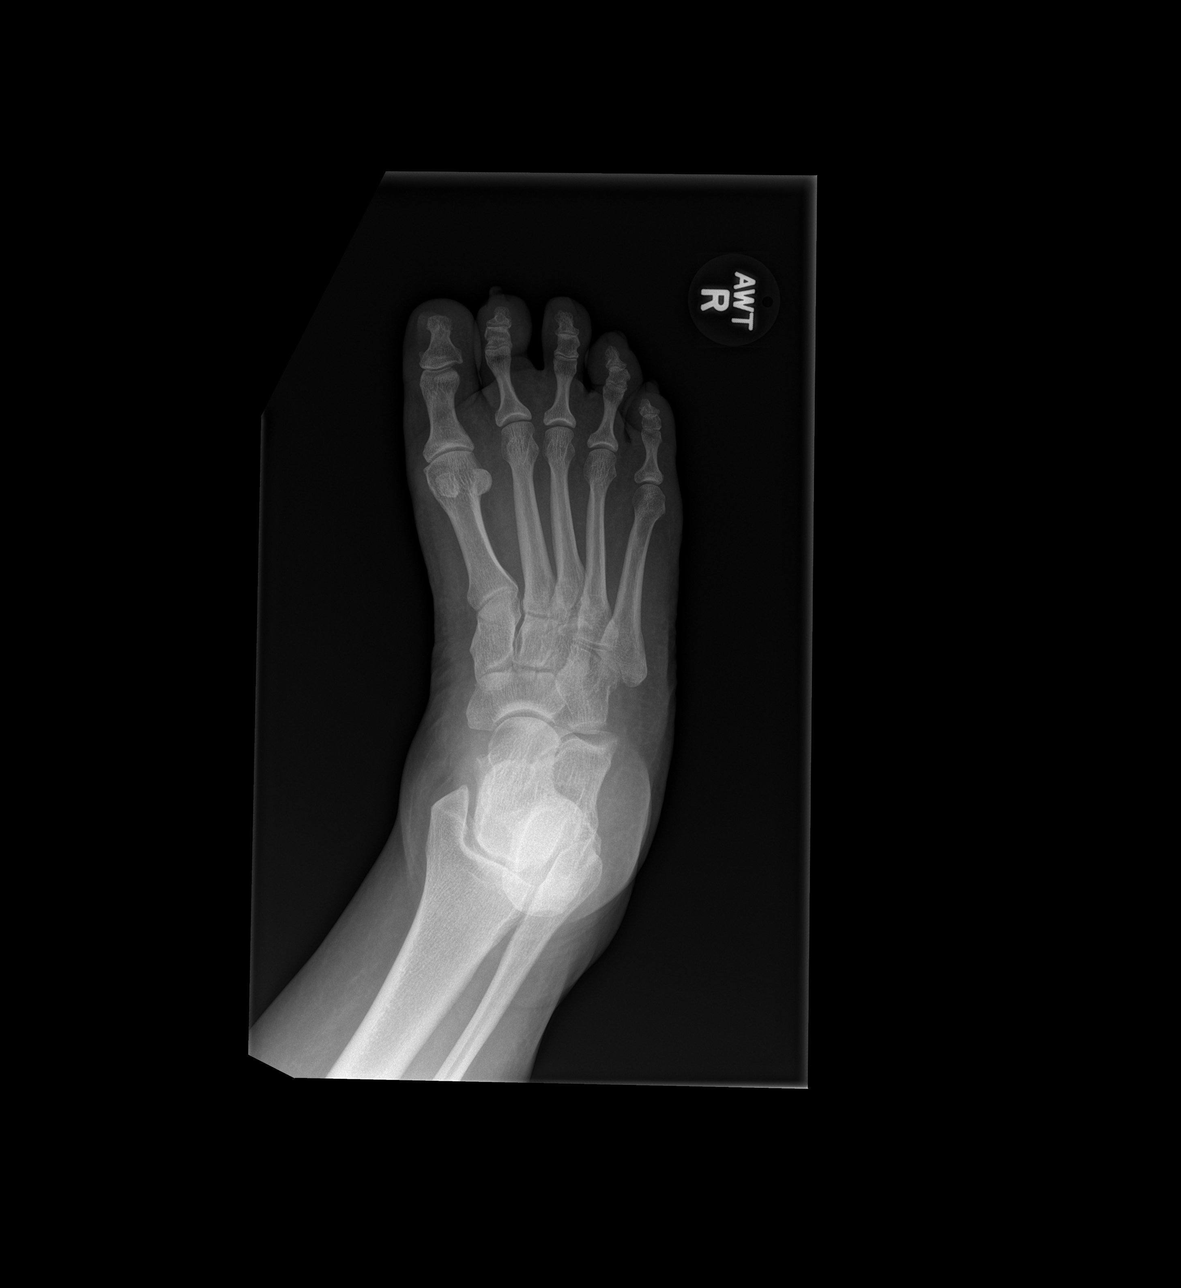

[x foot obl right]
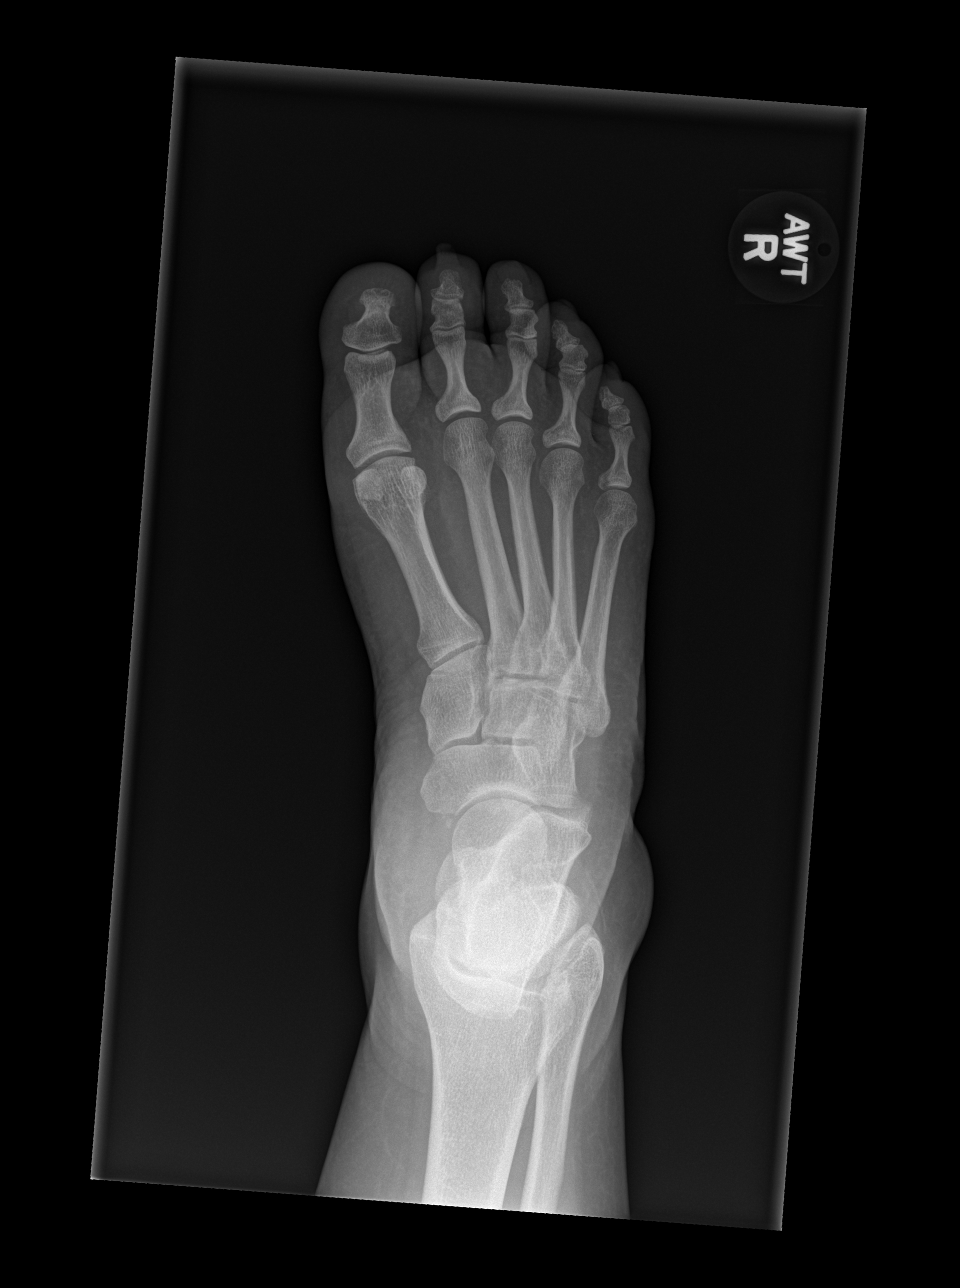

[x foot lat right]
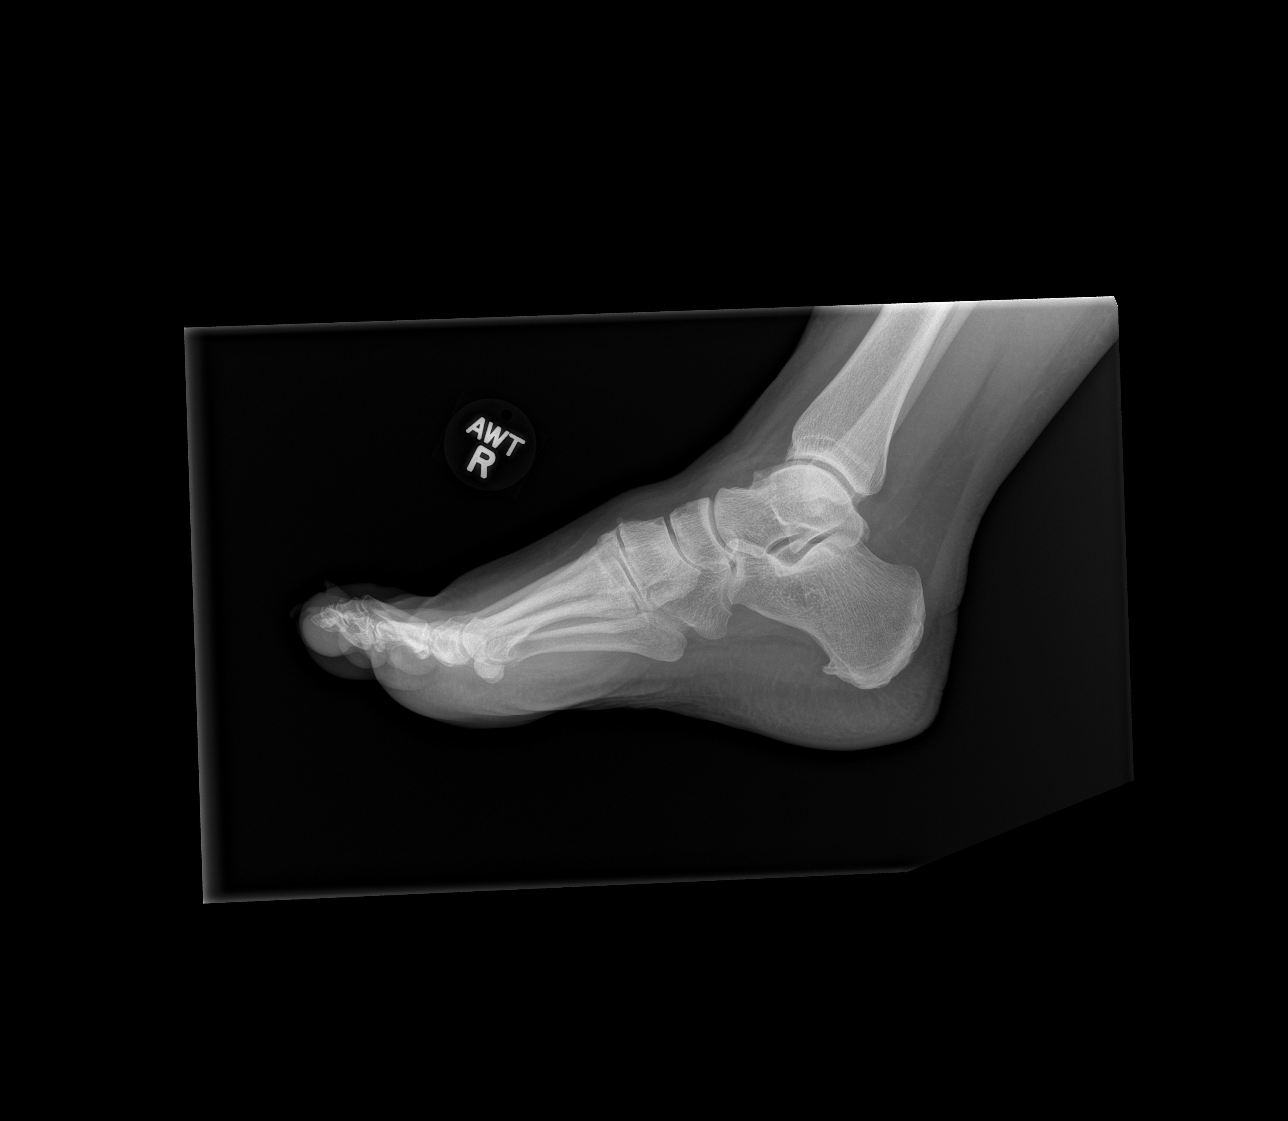

[3 of 3 positions shown; findings below may reference images not displayed]

FINDINGS: On the lateral view only, there is a small avulsion fracture from
what appears to be the dorsal aspect of the great toe distal
phalanx, across the dorsal distal tuft, without displacement or
comminution.

No other evidence of a fracture. Joints are normally spaced and
aligned.
IMPRESSION: Nondisplaced fracture across the dorsal aspect of the distal tuft of
the distal phalanx of the right great toe. No other fracture. No
joint abnormality.

## 2015-08-22 ENCOUNTER — Other Ambulatory Visit: Payer: Self-pay

## 2015-11-18 ENCOUNTER — Other Ambulatory Visit: Payer: Self-pay

## 2015-11-18 DIAGNOSIS — Z1231 Encounter for screening mammogram for malignant neoplasm of breast: Secondary | ICD-10-CM

## 2015-11-29 ENCOUNTER — Ambulatory Visit: Payer: Self-pay

## 2015-12-09 ENCOUNTER — Ambulatory Visit
Admission: RE | Admit: 2015-12-09 | Discharge: 2015-12-09 | Disposition: A | Discharge status: Home / Self Care | Payer: Managed Care, Other (non HMO) | Source: Ambulatory Visit

## 2015-12-09 DIAGNOSIS — Z1231 Encounter for screening mammogram for malignant neoplasm of breast: Secondary | ICD-10-CM

## 2016-03-30 ENCOUNTER — Encounter (HOSPITAL_COMMUNITY): Payer: Self-pay | Admitting: *Deleted

## 2016-03-30 ENCOUNTER — Emergency Department (HOSPITAL_COMMUNITY)
Admission: EM | Admit: 2016-03-30 | Discharge: 2016-03-30 | Disposition: A | Discharge status: Home / Self Care | Payer: Managed Care, Other (non HMO) | Attending: Emergency Medicine | Admitting: Emergency Medicine

## 2016-03-30 DIAGNOSIS — Z7982 Long term (current) use of aspirin: Secondary | ICD-10-CM | POA: Diagnosis not present

## 2016-03-30 DIAGNOSIS — E785 Hyperlipidemia, unspecified: Secondary | ICD-10-CM | POA: Diagnosis not present

## 2016-03-30 DIAGNOSIS — J45909 Unspecified asthma, uncomplicated: Secondary | ICD-10-CM | POA: Diagnosis not present

## 2016-03-30 DIAGNOSIS — Z7951 Long term (current) use of inhaled steroids: Secondary | ICD-10-CM | POA: Insufficient documentation

## 2016-03-30 DIAGNOSIS — Z87891 Personal history of nicotine dependence: Secondary | ICD-10-CM | POA: Diagnosis not present

## 2016-03-30 DIAGNOSIS — E119 Type 2 diabetes mellitus without complications: Secondary | ICD-10-CM | POA: Insufficient documentation

## 2016-03-30 DIAGNOSIS — I10 Essential (primary) hypertension: Secondary | ICD-10-CM | POA: Insufficient documentation

## 2016-03-30 DIAGNOSIS — K0889 Other specified disorders of teeth and supporting structures: Secondary | ICD-10-CM | POA: Diagnosis not present

## 2016-03-30 DIAGNOSIS — K029 Dental caries, unspecified: Secondary | ICD-10-CM | POA: Insufficient documentation

## 2016-03-30 MED ORDER — PENICILLIN V POTASSIUM 500 MG PO TABS: 500.0000 mg | ORAL_TABLET | Freq: Three times a day (TID) | ORAL | Status: AC

## 2016-03-30 MED ORDER — OXYCODONE HCL 5 MG PO TABS: 5.0000 mg | ORAL_TABLET | ORAL | Status: AC | PRN

## 2016-03-30 MED ORDER — IBUPROFEN 800 MG PO TABS: 800.0000 mg | ORAL_TABLET | Freq: Three times a day (TID) | ORAL | Status: AC

## 2016-03-30 MED ORDER — BUPIVACAINE-EPINEPHRINE (PF) 0.5% -1:200000 IJ SOLN
1.8000 mL | Freq: Once | INTRAMUSCULAR | Status: AC
  Administered 2016-03-30: 1.8 mL
  Filled 2016-03-30: qty 1.8

## 2016-03-30 NOTE — ED Notes (Signed)
THE PT IS C/O DENTAL PAIN AFTER SHE BROKE OFF A TOOTH YESTERDAY  LM;P NONE

## 2016-03-30 NOTE — ED Notes (Signed)
Pt verbalized understanding of d/c instructions and has no further questions. Pt stable and NAD.  

## 2016-03-30 NOTE — ED Provider Notes (Signed)
CSN: [id number]     Arrival date & time 03/30/16  2108 History  By signing my name below, I, Linna Darner, attest that this documentation has been prepared under the direction and in the presence of non-physician practitioner, Melburn Hake, PA-C. Electronically Signed: Linna Darner, Scribe. 03/30/2016. 9:36 PM.   Chief Complaint  Patient presents with  . Dental Pain    The history is provided by the patient. No language interpreter was used.     HPI Comments: Lacey Hines is a 55 y.o. female who presents to the Emergency Department complaining of sudden onset, constant, worsening, severe, right upper dental pain beginning yesterday morning. Pt reports that her right upper tooth broke off 3 weeks ago but she did not experience pain initially. Pt notes that the pain has started radiating up the right side of her face. Pt does not have a regular dentist; she called multiple dentists today but could not be seen. Pt has been eating normally. She has no known allergies to medications. She denies facial/neck swelling, drainage from the painful area, trouble swallowing, or any other associated symptoms.  Past Medical History  Diagnosis Date  . Asthma   . Hypertension   . Hypothyroid   . Other and unspecified hyperlipidemia   . Polycystic disease, ovaries   . Cough   . Type II or unspecified type diabetes mellitus without mention of complication, not stated as uncontrolled   . Fatigue   . Myalgia   . Allergy    Past Surgical History  Procedure Laterality Date  . Oophorectomy    . Abdominal hysterectomy      complete hysterectomy   Family History  Problem Relation Age of Onset  . Hypertension Mother   . Hyperlipidemia Mother   . Diabetes Father   . Heart disease Father   . Stroke Father   . Hypertension Father   . Hyperlipidemia Father   . Hypertension Brother   . Hyperlipidemia Brother   . Heart disease Brother   . Hypertension Brother   . Hyperlipidemia Brother   .  Hypertension Brother   . Hyperlipidemia Brother    Social History  Substance Use Topics  . Smoking status: Former Smoker -- 0.00 packs/day for 5 years  . Smokeless tobacco: Never Used  . Alcohol Use: No   OB History    No data available     Review of Systems  HENT: Positive for dental problem (right upper). Negative for facial swelling and trouble swallowing.        Negative for dental drainage.   Allergies  Review of patient's allergies indicates no known allergies.  Home Medications   Prior to Admission medications   Medication Sig Start Date End Date Taking? Authorizing Provider  albuterol (PROVENTIL HFA;VENTOLIN HFA) 108 (90 BASE) MCG/ACT inhaler Inhale 2 puffs into the lungs every 6 (six) hours as needed for wheezing. 09/22/13   Gillian Scarce, MD  aspirin 81 MG tablet Take 81 mg by mouth daily.    Historical Provider, MD  budesonide-formoterol (SYMBICORT) 160-4.5 MCG/ACT inhaler Inhale 2 puffs into the lungs 2 (two) times daily. 09/22/13 09/22/14  Gillian Scarce, MD  chlorpheniramine-HYDROcodone (TUSSIONEX PENNKINETIC ER) 10-8 MG/5ML LQCR Take 5 mLs by mouth every 12 (twelve) hours as needed. 01/25/14   Gillian Scarce, MD  diltiazem (CARDIZEM CD) 180 MG 24 hr capsule Take 1 capsule (180 mg total) by mouth daily. 09/29/13   Pricilla Riffle, MD  HYDROcodone-acetaminophen (NORCO/VICODIN) 5-325 MG per tablet Take  1 tablet by mouth every 6 (six) hours as needed for moderate pain. 09/06/14   Earley FavorGail Schulz, NP  ibuprofen (ADVIL,MOTRIN) 800 MG tablet Take 1 tablet (800 mg total) by mouth 3 (three) times daily. 03/30/16   Satira SarkNicole Elizabeth Nadeau, PA-C  INVOKANA 300 MG TABS daily 10/03/13   Historical Provider, MD  montelukast (SINGULAIR) 10 MG tablet Take 1 tablet (10 mg total) by mouth at bedtime. 09/22/13 09/22/14  Gillian Scarceobyn K Zanard, MD  Multiple Vitamins-Minerals (MULTIVITAL PO) Take by mouth daily.    Historical Provider, MD  nitroGLYCERIN (NITROSTAT) 0.4 MG SL tablet Put 1 tab under tongue  for chest pain. Wait 5 min and repeat x 2 if needed before calling 911. 08/20/13 08/20/14  Gillian Scarceobyn K Zanard, MD  omeprazole (PRILOSEC) 40 MG capsule Take 1 capsule (40 mg total) by mouth daily. 10/23/13   Pricilla RifflePaula Ross V, MD  oxyCODONE (ROXICODONE) 5 MG immediate release tablet Take 1 tablet (5 mg total) by mouth every 4 (four) hours as needed for severe pain. 03/30/16   Barrett HenleNicole Elizabeth Nadeau, PA-C  penicillin v potassium (VEETID) 500 MG tablet Take 1 tablet (500 mg total) by mouth 3 (three) times daily. 03/30/16 04/06/16  Barrett HenleNicole Elizabeth Nadeau, PA-C  rosuvastatin (CRESTOR) 20 MG tablet Take 1/2 - 1 tab po daily 09/22/13 09/22/14  Gillian Scarceobyn K Zanard, MD   BP 194/104 mmHg  Pulse 71  Temp(Src) 97.1 F (36.2 C) (Oral)  Resp 20  Wt 101.606 kg  SpO2 99% Physical Exam  Constitutional: She is oriented to person, place, and time. She appears well-developed and well-nourished.  HENT:  Head: Normocephalic and atraumatic.  Mouth/Throat: Uvula is midline, oropharynx is clear and moist and mucous membranes are normal. No oral lesions. No trismus in the jaw. Abnormal dentition. Dental caries present. No dental abscesses, uvula swelling or lacerations. No oropharyngeal exudate, posterior oropharyngeal edema, posterior oropharyngeal erythema or tonsillar abscesses.    Eyes: Conjunctivae and EOM are normal. Right eye exhibits no discharge. Left eye exhibits no discharge. No scleral icterus.  Pulmonary/Chest: Effort normal.  Neurological: She is alert and oriented to person, place, and time.  Nursing note and vitals reviewed.   ED Course  .Nerve Block Date/Time: 03/30/2016 10:19 PM Performed by: Barrett HenleNADEAU, NICOLE ELIZABETH Authorized by: Barrett HenleNADEAU, NICOLE ELIZABETH Consent: Verbal consent obtained. Risks and benefits: risks, benefits and alternatives were discussed Consent given by: patient Patient understanding: patient states understanding of the procedure being performed Patient identity confirmed: verbally with  patient Indications: pain relief Body area: face/mouth Nerve: infraorbital Laterality: right Patient position: sitting Needle gauge: 27 G Location technique: anatomical landmarks Local anesthetic: bupivacaine 0.5% with epinephrine Anesthetic total: 1.8 ml Outcome: pain improved Patient tolerance: Patient tolerated the procedure well with no immediate complications   (including critical care time)  DIAGNOSTIC STUDIES: Oxygen Saturation is 99% on RA, normal by my interpretation.    COORDINATION OF CARE: 9:36 PM Discussed treatment plan with pt at bedside and pt agreed to plan.  Labs Review Labs Reviewed - No data to display  Imaging Review No results found. I have personally reviewed and evaluated these images and lab results as part of my medical decision-making.   EKG Interpretation None      MDM   Final diagnoses:  Pain, dental    Patient with toothache.  No gross abscess.  Exam unconcerning for Ludwig's angina or spread of infection. Dental block performed in the ED with improvement of pain and no complications. Will treat with penicillin and pain medicine.  Urged patient to follow-up with dentist.  Pt given resources to follow up with dentist outpatient.  I personally performed the services described in this documentation, which was scribed in my presence. The recorded information has been reviewed and is accurate.   Satira Sark [place], [place] 03/30/16 2221  Marily Memos, MD 03/31/16 2232

## 2016-03-30 NOTE — Discharge Instructions (Signed)
Take your medication as prescribed. Follow-up with one of the dental clinics listed below at their next available appointment. Please return to the Emergency Department if symptoms worsen or new onset of fever, facial/neck swelling, drainage, unable to swallow, difficulty breathing.   [facility] of Dental Medicine Community Service Learning [facility] [address] [place], [place] [address] Phone [phone]  [facility] [employer] Community Service Learning Center in [place], [place], exemplifies the [employer] vision to improve the health and quality of life of all [place]ians by Public house manager with a passion to care for the underserved and by leading the nation in community-based, service learning oral health education.  We are committed to offering comprehensive general dental services for adults, children and special needs patients in a safe, caring and professional setting.   Appointments: Our clinic is open Monday through Friday 8:00 a.m. until 5:00 p.m. The amount of time scheduled for an appointment depends on the patients specific needs. We ask that you keep your appointed time for care or provide 24-hour notice of all appointment changes. Parents or legal guardians must accompany minor children.   Payment for Services: Medicaid and other insurance plans are welcome. Payment for services is due when services are rendered and may be made by cash or credit card. If you have dental insurance, we will assist you with your claim submission.    Emergencies:  Emergency services will be provided Monday through Friday on a walk-in basis.  Please arrive early for emergency services. After hours emergency services will be provided for patients of record as required.   Services:  Armed forces operational officer Dentistry Oral Surgery - Extractions Root Canals Sealants and Tooth Colored  Fillings Crowns and Bridges Dentures and Partial Dentures Implant Services Periodontal Services and Cleanings Cosmetic Armed forces operational officer 3-D/[employer] [facility]   [employer] [facility] [employer] is a great source of information about community services available.  Access by dialing 2-1-1 from anywhere in [place], or by website -  PooledIncome.pl.   Other Local Resources (Updated 11/2015)  Dental  Care   Services    Phone Number and Address  Cost  [facility] For children 97 - 24 years of age:   Cleaning  Tooth brushing/flossing instruction  Sealants, fillings, crowns  Extractions  Emergency treatment  [phone] [address] [place], [place] [address] Charges based on family income.  Medicaid and some insurance plans accepted.     [facility] - [facility], fillings, crowns  Extractions  Emergency treatment [phone] [place], [place]  Pregnant women 19 years of age or older with a Medicaid card  [facility] - [place]  Cleaning  Sealants, fillings, crowns  Extractions  Emergency treatment [phone] [address] [place], [place] Pregnant women 26 years of age or older with a Medicaid card  [facility] Department of Health - [facility] For children 6 - 57 years of age:   Cleaning  Tooth brushing/flossing instruction  Sealants, fillings, crowns  Extractions  Emergency treatment Limited orthodontic services for patients with Medicaid [phone] [address] [place], [place] [address] Medicaid and [facility] Health Choice cover for children up to age 41 and pregnant women.  Parents of children up to age 34 without Medicaid pay a reduced fee at time of service.  [facility]  Department of Public Health [place] For children 0 - 21 years of  age:   Cleaning  Tooth brushing/flossing instruction  Sealants, fillings, crowns  Extractions  Emergency treatment Limited orthodontic services for patients with Medicaid [phone] [address] [place], [place].  Medicaid and [place] Health Choice cover for children up to age 121 and pregnant women.  Parents of children up to age 54 without Medicaid pay a reduced fee.  [facility]  Cleaning  Sealants, fillings, crowns  Extractions  Hours: Tuesdays and Thursdays, 4:15 - 8 pm [phone] [address], [address] [place], [place] [phone] Services free of charge to [place] residents ages 18-64 who do not have health insurance, Medicare, [place], or [place] benefits and fall within federal poverty guidelines  [facility]    Provides dental care in addition to primary medical care, nutritional counseling, and pharmacy:  Nurse, mental healthCleaning  Sealants, fillings, crowns  Extractions                  [phone] [facility], [address] [place], [place]  [phone] [facility], [address] [place], [place]  [phone] [facility] [place], [place]  [phone] [facility], [address] [place], [place]  [phone] [facility] [address] [place], [place] Accepts [place], [place], most insurance.  Also provides services available to all with fees adjusted based on ability to pay.    [facility]  Cleaning  Tooth brushing/flossing instruction  Sealants, fillings, crowns  Extractions  Emergency treatment Hours: Tuesdays, Thursdays, and Fridays from 8 am to 5 pm by appointment only. [phone] [address] [place], [place] [phone] [facility] residents with Medicaid (depending on eligibility) and children with [facility] Health Choice - call for  more information.  [facility]  Extractions only  Hours: 2nd and 4th Thursday of each month from 6:30 am - 9 am.   [phone] [place], [place] [id number] Ages 3018 and older only.  Patients are seen on a first come, first served basis.  [employer]  [employer]  Fillings  Extractions  Orthodontics  Endodontics  Implants/Crowns/Bridges  Complete and partial dentures [phone] [place] Patients must complete an application for services.  There is often a waiting list.

## 2016-03-31 ENCOUNTER — Emergency Department (HOSPITAL_COMMUNITY)
Admission: EM | Admit: 2016-03-31 | Discharge: 2016-03-31 | Disposition: A | Discharge status: Home / Self Care | Payer: Managed Care, Other (non HMO) | Attending: Emergency Medicine | Admitting: Emergency Medicine

## 2016-03-31 ENCOUNTER — Encounter (HOSPITAL_COMMUNITY): Payer: Self-pay | Admitting: *Deleted

## 2016-03-31 DIAGNOSIS — Z87891 Personal history of nicotine dependence: Secondary | ICD-10-CM | POA: Insufficient documentation

## 2016-03-31 DIAGNOSIS — Z7982 Long term (current) use of aspirin: Secondary | ICD-10-CM | POA: Diagnosis not present

## 2016-03-31 DIAGNOSIS — Z79899 Other long term (current) drug therapy: Secondary | ICD-10-CM | POA: Insufficient documentation

## 2016-03-31 DIAGNOSIS — E119 Type 2 diabetes mellitus without complications: Secondary | ICD-10-CM | POA: Insufficient documentation

## 2016-03-31 DIAGNOSIS — E039 Hypothyroidism, unspecified: Secondary | ICD-10-CM | POA: Diagnosis not present

## 2016-03-31 DIAGNOSIS — Z7984 Long term (current) use of oral hypoglycemic drugs: Secondary | ICD-10-CM | POA: Insufficient documentation

## 2016-03-31 DIAGNOSIS — E784 Other hyperlipidemia: Secondary | ICD-10-CM | POA: Insufficient documentation

## 2016-03-31 DIAGNOSIS — Z792 Long term (current) use of antibiotics: Secondary | ICD-10-CM | POA: Insufficient documentation

## 2016-03-31 DIAGNOSIS — K047 Periapical abscess without sinus: Secondary | ICD-10-CM | POA: Diagnosis not present

## 2016-03-31 DIAGNOSIS — I10 Essential (primary) hypertension: Secondary | ICD-10-CM | POA: Insufficient documentation

## 2016-03-31 DIAGNOSIS — R220 Localized swelling, mass and lump, head: Secondary | ICD-10-CM | POA: Diagnosis present

## 2016-03-31 DIAGNOSIS — J45909 Unspecified asthma, uncomplicated: Secondary | ICD-10-CM | POA: Diagnosis not present

## 2016-03-31 DIAGNOSIS — Z791 Long term (current) use of non-steroidal anti-inflammatories (NSAID): Secondary | ICD-10-CM | POA: Diagnosis not present

## 2016-03-31 DIAGNOSIS — Z79891 Long term (current) use of opiate analgesic: Secondary | ICD-10-CM | POA: Insufficient documentation

## 2016-03-31 MED ORDER — STERILE WATER FOR INJECTION IJ SOLN
10.0000 mL | Freq: Once | INTRAMUSCULAR | Status: AC
  Administered 2016-03-31: 10 mL via INTRAMUSCULAR
  Filled 2016-03-31: qty 10

## 2016-03-31 MED ORDER — OXYCODONE-ACETAMINOPHEN 5-325 MG PO TABS
1.0000 | ORAL_TABLET | Freq: Once | ORAL | Status: AC
  Administered 2016-03-31: 1 via ORAL
  Filled 2016-03-31: qty 1

## 2016-03-31 MED ORDER — CEFTRIAXONE SODIUM 1 G IJ SOLR
1.0000 g | Freq: Once | INTRAMUSCULAR | Status: AC
  Administered 2016-03-31: 1 g via INTRAMUSCULAR
  Filled 2016-03-31: qty 10

## 2016-03-31 NOTE — ED Notes (Signed)
Declined W/C at D/C and was escorted to lobby by RN. 

## 2016-03-31 NOTE — Discharge Instructions (Signed)

## 2016-03-31 NOTE — ED Provider Notes (Signed)
CSN: [id number]     Arrival date & time 03/31/16  1210 History  By signing my name below, I, Phillis HaggisGabriella Gaje, attest that this documentation has been prepared under the direction and in the presence of Langston MaskerKaren Sofia, [place]-C. Electronically Signed: Phillis HaggisGabriella Gaje, ED Scribe. 03/31/2016. 12:46 PM.   Chief Complaint  Patient presents with  . Facial Swelling   The history is provided by the patient. No language interpreter was used.  HPI Comments: Franklyn Lornnette Donelan is a 55 y.o. Female with a hx of HTN and Type II DM who presents to the Emergency Department complaining of right sided facial swelling onset earlier this morning. Pt was seen yesterday in the ED for dental pain. She was given a dental block and woke up this morning with the facial swelling, new from yesterday. She was also prescribed pain medication and antibiotics upon discharge. Pt does not have a dentist. She denies fever, chills, nausea, or vomiting.   Past Medical History  Diagnosis Date  . Asthma   . Hypertension   . Hypothyroid   . Other and unspecified hyperlipidemia   . Polycystic disease, ovaries   . Cough   . Type II or unspecified type diabetes mellitus without mention of complication, not stated as uncontrolled   . Fatigue   . Myalgia   . Allergy    Past Surgical History  Procedure Laterality Date  . Oophorectomy    . Abdominal hysterectomy      complete hysterectomy   Family History  Problem Relation Age of Onset  . Hypertension Mother   . Hyperlipidemia Mother   . Diabetes Father   . Heart disease Father   . Stroke Father   . Hypertension Father   . Hyperlipidemia Father   . Hypertension Brother   . Hyperlipidemia Brother   . Heart disease Brother   . Hypertension Brother   . Hyperlipidemia Brother   . Hypertension Brother   . Hyperlipidemia Brother    Social History  Substance Use Topics  . Smoking status: Former Smoker -- 0.00 packs/day for 5 years  . Smokeless tobacco: Never Used  . Alcohol Use: No    OB History    No data available     Review of Systems  HENT: Positive for dental problem and facial swelling.   All other systems reviewed and are negative.  Allergies  Review of patient's allergies indicates no known allergies.  Home Medications   Prior to Admission medications   Medication Sig Start Date End Date Taking? Authorizing Provider  albuterol (PROVENTIL HFA;VENTOLIN HFA) 108 (90 BASE) MCG/ACT inhaler Inhale 2 puffs into the lungs every 6 (six) hours as needed for wheezing. 09/22/13   Gillian Scarceobyn K Zanard, MD  aspirin 81 MG tablet Take 81 mg by mouth daily.    Historical Provider, MD  budesonide-formoterol (SYMBICORT) 160-4.5 MCG/ACT inhaler Inhale 2 puffs into the lungs 2 (two) times daily. 09/22/13 09/22/14  Gillian Scarceobyn K Zanard, MD  chlorpheniramine-HYDROcodone (TUSSIONEX PENNKINETIC ER) 10-8 MG/5ML LQCR Take 5 mLs by mouth every 12 (twelve) hours as needed. 01/25/14   Gillian Scarceobyn K Zanard, MD  diltiazem (CARDIZEM CD) 180 MG 24 hr capsule Take 1 capsule (180 mg total) by mouth daily. 09/29/13   Pricilla RifflePaula Ross V, MD  HYDROcodone-acetaminophen (NORCO/VICODIN) 5-325 MG per tablet Take 1 tablet by mouth every 6 (six) hours as needed for moderate pain. 09/06/14   Earley FavorGail Schulz, NP  ibuprofen (ADVIL,MOTRIN) 800 MG tablet Take 1 tablet (800 mg total) by mouth 3 (three) times  daily. 03/30/16   Barrett Henle, PA-C  INVOKANA 300 MG TABS daily 10/03/13   Historical Provider, MD  montelukast (SINGULAIR) 10 MG tablet Take 1 tablet (10 mg total) by mouth at bedtime. 09/22/13 09/22/14  Gillian Scarce, MD  Multiple Vitamins-Minerals (MULTIVITAL PO) Take by mouth daily.    Historical Provider, MD  nitroGLYCERIN (NITROSTAT) 0.4 MG SL tablet Put 1 tab under tongue for chest pain. Wait 5 min and repeat x 2 if needed before calling 911. 08/20/13 08/20/14  Gillian Scarce, MD  omeprazole (PRILOSEC) 40 MG capsule Take 1 capsule (40 mg total) by mouth daily. 10/23/13   Pricilla Riffle, MD  oxyCODONE (ROXICODONE) 5  MG immediate release tablet Take 1 tablet (5 mg total) by mouth every 4 (four) hours as needed for severe pain. 03/30/16   Barrett Henle, PA-C  penicillin v potassium (VEETID) 500 MG tablet Take 1 tablet (500 mg total) by mouth 3 (three) times daily. 03/30/16 04/06/16  Barrett Henle, PA-C  rosuvastatin (CRESTOR) 20 MG tablet Take 1/2 - 1 tab po daily 09/22/13 09/22/14  Gillian Scarce, MD   BP 142/98 mmHg  Pulse 75  Temp(Src) 97.7 F (36.5 C) (Oral)  Resp 14  SpO2 98% Physical Exam  Constitutional: She is oriented to person, place, and time. She appears well-developed and well-nourished.  HENT:  Head: Normocephalic and atraumatic.  Mouth/Throat: Oropharynx is clear and moist. No oropharyngeal exudate.  Swollen right face; dental decay right lower gumline  Eyes: Conjunctivae and EOM are normal. Pupils are equal, round, and reactive to light.  Neck: Normal range of motion. Neck supple.  Cardiovascular: Normal rate and regular rhythm.   Musculoskeletal: Normal range of motion.  Neurological: She is alert and oriented to person, place, and time.  Skin: Skin is warm and dry.  Psychiatric: She has a normal mood and affect. Her behavior is normal.  Nursing note and vitals reviewed.   ED Course  Procedures (including critical care time) DIAGNOSTIC STUDIES: Oxygen Saturation is 98% on RA, normal by my interpretation.    COORDINATION OF CARE: 12:45 PM-Discussed treatment plan which includes IM antibiotics with pt at bedside and pt agreed to plan.    Labs Review Labs Reviewed - No data to display  Imaging Review No results found. I have personally reviewed and evaluated these images and lab results as part of my medical decision-making.   EKG Interpretation None      MDM   Final diagnoses:  Dental abscess    An After Visit Summary was printed and given to the patient. Continue current medications.  Lonia Skinner [place], PA-C 03/31/16 1346  Tilden Fossa,  MD 04/01/16 [phone]

## 2016-03-31 NOTE — ED Notes (Signed)
States she started with a toothe ache on thurs. Was seen here last pm was given a dental block this am woke with swelling to right side of her face
